# Patient Record
Sex: Male | Born: 1965 | Race: White | Hispanic: No | Marital: Married | Smoking: Never smoker
Health system: Southern US, Community
[De-identification: ages and names within clinical notes are randomized; demographics above are authoritative.]

## PROBLEM LIST (undated history)

## (undated) DIAGNOSIS — M1990 Unspecified osteoarthritis, unspecified site: Secondary | ICD-10-CM

## (undated) DIAGNOSIS — L409 Psoriasis, unspecified: Principal | ICD-10-CM

## (undated) DIAGNOSIS — I10 Essential (primary) hypertension: Secondary | ICD-10-CM

## (undated) DIAGNOSIS — L4050 Arthropathic psoriasis, unspecified: Secondary | ICD-10-CM

## (undated) HISTORY — DX: Arthropathic psoriasis, unspecified: L40.50

## (undated) HISTORY — DX: Unspecified osteoarthritis, unspecified site: M19.90

## (undated) HISTORY — PX: ADENOIDECTOMY: SUR15

## (undated) HISTORY — DX: Psoriasis, unspecified: L40.9

---

## 2011-12-19 LAB — LAB REPORT - SCANNED: HM Hepatitis Screen: NEGATIVE

## 2012-03-07 ENCOUNTER — Emergency Department: Payer: Self-pay | Admitting: Emergency Medicine

## 2015-10-03 LAB — CBC AND DIFFERENTIAL
HEMATOCRIT: 45 % (ref 41–53)
HEMOGLOBIN: 15.2 g/dL (ref 13.5–17.5)
PLATELETS: 247 10*3/uL (ref 150–399)
WBC: 6.9 10*3/mL

## 2015-10-03 LAB — BASIC METABOLIC PANEL
BUN: 9 mg/dL (ref 4–21)
CREATININE: 1 mg/dL (ref 0.6–1.3)
Glucose: 95 mg/dL
Potassium: 4.6 mmol/L (ref 3.4–5.3)
Sodium: 141 mmol/L (ref 137–147)

## 2015-10-03 LAB — HEPATIC FUNCTION PANEL
ALT: 41 U/L — AB (ref 10–40)
AST: 26 U/L (ref 14–40)
Alkaline Phosphatase: 65 U/L (ref 25–125)
BILIRUBIN, TOTAL: 0.7 mg/dL

## 2015-12-09 ENCOUNTER — Encounter: Payer: Self-pay | Admitting: *Deleted

## 2015-12-09 DIAGNOSIS — L409 Psoriasis, unspecified: Secondary | ICD-10-CM

## 2015-12-09 DIAGNOSIS — L4050 Arthropathic psoriasis, unspecified: Secondary | ICD-10-CM

## 2015-12-09 HISTORY — DX: Psoriasis, unspecified: L40.9

## 2015-12-09 HISTORY — DX: Arthropathic psoriasis, unspecified: L40.50

## 2015-12-09 NOTE — Progress Notes (Signed)
*IMAGE* Office Visit Note  Patient: Michael LamasMark Strahle             Date of Birth: 1965/07/30           MRN: [id number]             PCP: No primary care provider on file. Referring: No ref. provider found Visit Date: 12/11/2015 Occupation:@GUAROCC @    Subjective:  Follow-up Follow-up on psoriatic arthritis and psoriasis and high risk prescription  History of Present Illness: Michael Peck is a 50 y.o. male last seen in our office on 09/04/2015. On that visit patient was not doing well on Humira. He has been on Humira for the last 2-1/2 years. Because it wasn't addressing his psoriatic arthritis well, we decided to apply for Cosentyx and he is supposed to come back in November (i.e. today) to see if he is started Consentyx to see if it is working for him. In addition he was on methotrexate 4 pills every week and folic acid 2 every day but we had to stop methotrexate because his liver functions got elevated.  Patient reports today that he has been taking his CosEntex as we discussed.  Activities of Daily Living:  Patient reports morning stiffness for 30 minutes.   Patient Reports nocturnal pain.  Difficulty dressing/grooming: Reports Difficulty climbing stairs: Reports Difficulty getting out of chair: Reports Difficulty using hands for taps, buttons, cutlery, and/or writing: Reports   No Rheumatology ROS completed.   PMFS History:  Patient Active Problem List   Diagnosis Date Noted  . High risk medication use 12/10/2015  . Low back pain 12/10/2015  . Obesity 12/10/2015  . Psoriasis 12/09/2015  . Psoriatic arthritis (HCC) 12/09/2015    Past Medical History:  Diagnosis Date  . Arthritis   . Psoriasis 12/09/2015  . Psoriatic arthritis (HCC) 12/09/2015   Failed Enbrel    History reviewed. No pertinent family history. Past Surgical History:  Procedure Laterality Date  . ADENOIDECTOMY     Social History   Social History Narrative  . No narrative on file     Objective: Vital  Signs: BP (!) 145/85 (BP Location: Left Arm, Patient Position: Sitting, Cuff Size: Large)   Pulse 75   Resp 12   Ht 5\' 10"  (1.778 m)   Wt 223 lb (101.2 kg)   BMI 32.00 kg/m    Physical Exam   Musculoskeletal Exam:  Full range of motion of all joints Fibromyalgia points are all absent Grip strength is equal and strong bilaterally  CDAI Exam: No CDAI exam completed.   History of DIP PIP prominence bilaterally  Investigation: No additional findings.  X-rays done on 09/04/2015 of bilateral hands with 2 views and bilateral feet with 2 views. Please see July 2017 dictation for full details  Imaging: No results found.      Pt started Cosentyx recently.  Finished loading doses of Cosentyx.  Will start maintenance dose next week and will do it monthly. This dose is 300 mg every month.  So far the patient feels that he is seeing some benefit from this medication as he did when he was initially put on Humira.  He also feels that it is addressing his psoriasis somewhat.   Although it's too early to say we will check him versus these pictures in 3 months from now to see how much improvement he has seen.   Speciality Comments: No specialty comments available.   Procedures:  No procedures performed Allergies: Review of patient's allergies indicates  no known allergies.   Assessment / Plan: Visit Diagnoses: Psoriatic arthritis (HCC) - Failed Enbrel, Sacroiliitis, Dactylitis, Erosive disease Arthritis MUTILANS  Psoriasis  High risk medication use - FAILED ENBRELFAILED HUMIRATRIAL COSENTYXTHEN CONSIDER REMICADE INFUSION IF COSENTYX FAILS - Plan: Quantiferon tb gold assay (blood), CBC with Differential/Platelet, COMPLETE METABOLIC PANEL WITH GFR  Low back pain, unspecified back pain laterality, unspecified chronicity, with sciatica presence unspecified  Obesity, unspecified classification, unspecified obesity type, unspecified whether serious comorbidity present   I will  update tb gold today since it is due  I will get cbc w/ diff and cmp w/ gfr every 2 months starting today  I have advised the patient to f-u 3months  I discussed with the patient the following if CoSyntex fails ==>consider REMICADE Infusion in in a few months, no evidence of adequate response.  We have taken a photograph of patient's extensive psoriasis located mostly on the anterior tib-fib area below the knees.  We are hopeful that the Cosentyx will add benefit to the patient psoriasis and will get under better control as the months go by. I plan to check him again by doing another photographs at next visit.  In aproximately 3 months at the next visit to see if any improvement  Note that patient has Failed Humira  Note that patient has Failed Enberl in past;   Orders: Orders Placed This Encounter  Procedures  . Quantiferon tb gold assay (blood)  . CBC with Differential/Platelet  . COMPLETE METABOLIC PANEL WITH GFR   Meds ordered this encounter  Medications  . Secukinumab (COSENTYX SENSOREADY PEN) 150 MG/ML SOAJ    Sig: Inject 2 pens into the skin every 30 (thirty) days.    Dispense:  2 pen    Refill:  2  . methotrexate (RHEUMATREX) 2.5 MG tablet    Sig: Take 3 tablets (7.5 mg total) by mouth once a week. Caution:Chemotherapy. Protect from light.    Dispense:  12 tablet    Refill:  2  . folic acid (FOLVITE) 1 MG tablet    Sig: Take by mouth.  . DISCONTD: methotrexate (RHEUMATREX) 2.5 MG tablet    Sig: TAKE 4 TABLETS BY MOUTH WEEKLY  . DISCONTD: COSENTYX SENSOREADY 300 DOSE 150 MG/ML SOAJ    Face-to-face time spent with patient was 40 minutes. 50% of time was spent in counseling and coordination of care.  Follow-Up Instructions: Return in about 3 months (around 03/12/2016) for PsA, Ps lesion improvement, Cosentyx response still good?.     I examined and evaluated the patient with Tawni PummelNaitik Manvir Prabhu PA. The plan of care was discussed as noted above.  Pollyann SavoyShaili Deveshwar, MD

## 2015-12-10 DIAGNOSIS — M545 Low back pain, unspecified: Secondary | ICD-10-CM | POA: Insufficient documentation

## 2015-12-10 DIAGNOSIS — E669 Obesity, unspecified: Secondary | ICD-10-CM | POA: Insufficient documentation

## 2015-12-10 DIAGNOSIS — Z79899 Other long term (current) drug therapy: Secondary | ICD-10-CM | POA: Insufficient documentation

## 2015-12-10 NOTE — Assessment & Plan Note (Signed)
Failed Enbrel, Sacroiliitis, Dactylitis, Erosive disease  Arthritis MUTILANS

## 2015-12-11 ENCOUNTER — Ambulatory Visit (INDEPENDENT_AMBULATORY_CARE_PROVIDER_SITE_OTHER): Payer: 59 | Admitting: Rheumatology

## 2015-12-11 ENCOUNTER — Encounter: Payer: Self-pay | Admitting: Rheumatology

## 2015-12-11 ENCOUNTER — Other Ambulatory Visit: Payer: Self-pay | Admitting: Rheumatology

## 2015-12-11 VITALS — BP 145/85 | HR 75 | Resp 12 | Ht 70.0 in | Wt 223.0 lb

## 2015-12-11 DIAGNOSIS — L4050 Arthropathic psoriasis, unspecified: Secondary | ICD-10-CM | POA: Diagnosis not present

## 2015-12-11 DIAGNOSIS — L409 Psoriasis, unspecified: Secondary | ICD-10-CM

## 2015-12-11 DIAGNOSIS — Z79899 Other long term (current) drug therapy: Secondary | ICD-10-CM

## 2015-12-11 DIAGNOSIS — M545 Low back pain: Secondary | ICD-10-CM | POA: Diagnosis not present

## 2015-12-11 DIAGNOSIS — E669 Obesity, unspecified: Secondary | ICD-10-CM | POA: Diagnosis not present

## 2015-12-11 LAB — CBC WITH DIFFERENTIAL/PLATELET
Basophils Absolute: 63 cells/uL (ref 0–200)
Basophils Relative: 1 %
EOS PCT: 3 %
Eosinophils Absolute: 189 cells/uL (ref 15–500)
HCT: 43.1 % (ref 38.5–50.0)
HEMOGLOBIN: 14.5 g/dL (ref 13.2–17.1)
LYMPHS ABS: 2331 {cells}/uL (ref 850–3900)
Lymphocytes Relative: 37 %
MCH: 31.3 pg (ref 27.0–33.0)
MCHC: 33.6 g/dL (ref 32.0–36.0)
MCV: 93.1 fL (ref 80.0–100.0)
MONOS PCT: 10 %
MPV: 11.6 fL (ref 7.5–12.5)
Monocytes Absolute: 630 cells/uL (ref 200–950)
NEUTROS ABS: 3087 {cells}/uL (ref 1500–7800)
NEUTROS PCT: 49 %
PLATELETS: 233 10*3/uL (ref 140–400)
RBC: 4.63 MIL/uL (ref 4.20–5.80)
RDW: 13.6 % (ref 11.0–15.0)
WBC: 6.3 10*3/uL (ref 3.8–10.8)

## 2015-12-11 MED ORDER — METHOTREXATE 2.5 MG PO TABS: 7.5000 mg | ORAL_TABLET | ORAL | 2 refills | Status: DC

## 2015-12-11 MED ORDER — SECUKINUMAB 150 MG/ML ~~LOC~~ SOAJ: 2.0000 "pen " | SUBCUTANEOUS | 2 refills | Status: DC

## 2015-12-12 LAB — COMPLETE METABOLIC PANEL WITH GFR
ALBUMIN: 4.1 g/dL (ref 3.6–5.1)
ALK PHOS: 58 U/L (ref 40–115)
ALT: 34 U/L (ref 9–46)
AST: 25 U/L (ref 10–40)
BUN: 16 mg/dL (ref 7–25)
CO2: 23 mmol/L (ref 20–31)
Calcium: 9 mg/dL (ref 8.6–10.3)
Chloride: 106 mmol/L (ref 98–110)
Creat: 0.95 mg/dL (ref 0.60–1.35)
GFR, Est African American: >89 mL/min (ref >=60)
GFR, Est Non African American: >89 mL/min (ref >=60)
GLUCOSE: 114 mg/dL — AB (ref 65–99)
POTASSIUM: 3.9 mmol/L (ref 3.5–5.3)
SODIUM: 140 mmol/L (ref 135–146)
Total Bilirubin: 0.7 mg/dL (ref 0.2–1.2)
Total Protein: 6.8 g/dL (ref 6.1–8.1)

## 2015-12-14 LAB — QUANTIFERON TB GOLD ASSAY (BLOOD)
Interferon Gamma Release Assay: NEGATIVE
MITOGEN-NIL SO: 2.61 [IU]/mL
QUANTIFERON TB AG MINUS NIL: 0.02 [IU]/mL
Quantiferon Nil Value: 0.03 IU/mL

## 2015-12-14 NOTE — Progress Notes (Signed)
Labs (cbc cmp tbgold) are wnl

## 2016-01-21 ENCOUNTER — Other Ambulatory Visit: Payer: Self-pay | Admitting: *Deleted

## 2016-01-21 MED ORDER — METHOTREXATE 2.5 MG PO TABS: 7.5000 mg | ORAL_TABLET | ORAL | 0 refills | Status: DC

## 2016-01-21 NOTE — Telephone Encounter (Signed)
Received request for Methotrexate to be filled as a 90 day supply via fax.   Last Visit: 12/11/15 Next visit: 04/01/15 Labs: 12/11/15 WNL  Prescription changed to 90 supply.

## 2016-03-18 ENCOUNTER — Ambulatory Visit: Payer: 59 | Admitting: Rheumatology

## 2016-03-21 ENCOUNTER — Other Ambulatory Visit: Payer: Self-pay | Admitting: Rheumatology

## 2016-03-21 NOTE — Telephone Encounter (Signed)
Last Visit: 12/11/15 Next Visit: 03/31/16 Labs: 12/11/15 WNL TB Gold: 12/11/15 Neg  Okay to refill Cosentyx?

## 2016-03-24 NOTE — Progress Notes (Signed)
Office Visit Note  Patient: Michael Peck             Date of Birth: 02-26-1965           MRN: [id number]             PCP: No primary care provider on file. Referring: No ref. provider found Visit Date: 03/31/2016 Occupation: _0 @    Subjective: Follow up on Psoriatic arthritis   History of Present Illness: Michael Peck is a 51 y.o. male with history of psoriasis and psoriatic arthritis. He is been on Cosyntex in September 2017. He has not noticed much improvement in his joint symptoms. He states his psoriasis has not changed much except that it response to the topical agents. He is been tolerating Cosyntex well. He's been taking methotrexate only 3 tablets per week due to prior elevation of LFTs. He reports pain and discomfort mostly in his hands. He had an episode of lower back pain about a month ago which resolved.  Activities of Daily Living:  Patient reports morning stiffness for15  minutes.   Patient Denies nocturnal pain.  Difficulty dressing/grooming: Denies Difficulty climbing stairs: Denies Difficulty getting out of chair: Denies Difficulty using hands for taps, buttons, cutlery, and/or writing: Denies   Review of Systems  Constitutional: Negative for fatigue, night sweats and weakness ( ).  HENT: Negative for mouth sores, mouth dryness and nose dryness.   Eyes: Negative for redness and dryness.  Respiratory: Negative for shortness of breath and difficulty breathing.   Cardiovascular: Positive for hypertension. Negative for chest pain, palpitations, irregular heartbeat and swelling in legs/feet.  Gastrointestinal: Negative for constipation and diarrhea.  Endocrine: Negative for increased urination.  Musculoskeletal: Positive for arthralgias and joint pain. Negative for joint swelling, myalgias, muscle weakness, morning stiffness, muscle tenderness and myalgias.  Skin: Positive for rash. Negative for color change, hair loss, nodules/bumps, skin tightness, ulcers and  sensitivity to sunlight.       Psoriasis  Allergic/Immunologic: Negative for susceptible to infections.  Neurological: Negative for dizziness, fainting, memory loss and night sweats.  Hematological: Negative for swollen glands.  Psychiatric/Behavioral: Negative for depressed mood and sleep disturbance. The patient is not nervous/anxious.     PMFS History:  Patient Active Problem List   Diagnosis Date Noted  . High risk medication use 12/10/2015  . Low back pain 12/10/2015  . Obesity 12/10/2015  . Psoriasis 12/09/2015  . Psoriatic arthritis ([place]) 12/09/2015    Past Medical History:  Diagnosis Date  . Arthritis   . Psoriasis 12/09/2015  . Psoriatic arthritis ([place]) 12/09/2015   Failed Enbrel    No family history on file. Past Surgical History:  Procedure Laterality Date  . ADENOIDECTOMY     Social History   Social History Narrative  . No narrative on file     Objective: Vital Signs: BP 136/78   Pulse 78   Resp 14   Ht _1  (1.778 m)   Wt 220 lb (99.8 kg)   BMI 31.57 kg/m    Physical Exam  Constitutional: He is oriented to person, place, and time. He appears well-developed and well-nourished.  HENT:  Head: Normocephalic and atraumatic.  Eyes: Conjunctivae and EOM are normal. Pupils are equal, round, and reactive to light.  Neck: Normal range of motion. Neck supple.  Cardiovascular: Normal rate, regular rhythm and normal heart sounds.   Pulmonary/Chest: Effort normal and breath sounds normal.  Abdominal: Soft. Bowel sounds are normal.  Neurological: He is alert and oriented  to person, place, and time.  Skin: Skin is warm and dry. Capillary refill takes less than 2 seconds. Rash noted.  Rash localized to bilateral elbows, bilateral shin area  Psychiatric: He has a normal mood and affect. His behavior is normal.  Nursing note and vitals reviewed.    Musculoskeletal Exam: C-spine, thoracic, lumbar spine good range of motion to SI joint tenderness. Shoulder joints  elbow joints wrist joints with good range of motion. 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 Hip joints knee joints ankles MTPs PIPs with good range of motion with no synovitis.  CDAI Exam: CDAI Homunculus Exam:   Tenderness:  Right hand: 3rd PIP and 2nd DIP Left hand: 3rd PIP and 2nd DIP  Joint Counts:  CDAI Tender Joint count: 2 CDAI Swollen Joint count: 0  Global Assessments:  Patient Global Assessment: 7 Provider Global Assessment: 4  CDAI Calculated Score: 13    Investigation: Findings:  TB gold neg 12/2015  Office Visit on 12/11/2015  Component Date Value Ref Range Status  . Interferon Gamma Release Assay 12/11/2015 NEGATIVE  NEGATIVE Final  . Quantiferon Nil Value 12/11/2015 0.03  IU/mL Final  . Mitogen-Nil 12/11/2015 2.61  IU/mL Final  . Quantiferon Tb Ag Minus Nil Value 12/11/2015 0.02  IU/mL Final   Comment:   The Nil tube value is used to determine if the patient has a preexisting immune response which could cause a false-positive reading on the test. In order for a test to be valid, the Nil tube must have a value of less than or equal to 8.0 IU/mL.   The mitogen control tube is used to assure the patient has a healthy immune status and also serves as a control for correct blood handling and incubation. It is used to detect false-negative readings. The mitogen tube must have a gamma interferon value of greater than or equal to 0.5 IU/mL higher than the value of the Nil tube.   The TB antigen tube is coated with the M. tuberculosis specific antigens. For a test to be considered positive, the TB antigen tube value minus the Nil tube value must be greater than or equal to 0.35 IU/mL.   For additional information, please refer to http://education.[facility].com/faq/QFT (This link is being provided for informational/educational purposes only.)   . WBC 12/11/2015 6.3  3.8 - 10.8  K/uL Final  . RBC 12/11/2015 4.63  4.20 - 5.80 MIL/uL Final  . Hemoglobin 12/11/2015 14.5  13.2 - 17.1 g/dL Final  . HCT 12/11/2015 43.1  38.5 - 50.0 % Final  . MCV 12/11/2015 93.1  80.0 - 100.0 fL Final  . MCH 12/11/2015 31.3  27.0 - 33.0 pg Final  . MCHC 12/11/2015 33.6  32.0 - 36.0 g/dL Final  . RDW 12/11/2015 13.6  11.0 - 15.0 % Final  . Platelets 12/11/2015 233  140 - 400 K/uL Final  . MPV 12/11/2015 11.6  7.5 - 12.5 fL Final  . Neutro Abs 12/11/2015 3087  1,500 - 7,800 cells/uL Final  . Lymphs Abs 12/11/2015 2331  850 - 3,900 cells/uL Final  . Monocytes Absolute 12/11/2015 630  200 - 950 cells/uL Final  . Eosinophils Absolute 12/11/2015 189  15 - 500 cells/uL Final  . Basophils Absolute 12/11/2015 63  0 - 200 cells/uL Final  . Neutrophils Relative % 12/11/2015 49  % Final  . Lymphocytes Relative 12/11/2015 37  % Final  . Monocytes Relative 12/11/2015 10  %  Final  . Eosinophils Relative 12/11/2015 3  % Final  . Basophils Relative 12/11/2015 1  % Final  . Smear Review 12/11/2015 Criteria for review not met   Final  . Sodium 12/11/2015 140  135 - 146 mmol/L Final  . Potassium 12/11/2015 3.9  3.5 - 5.3 mmol/L Final  . Chloride 12/11/2015 106  98 - 110 mmol/L Final  . CO2 12/11/2015 23  20 - 31 mmol/L Final  . Glucose, Bld 12/11/2015 114* 65 - 99 mg/dL Final  . BUN 12/11/2015 16  7 - 25 mg/dL Final  . Creat 12/11/2015 0.95  0.60 - 1.35 mg/dL Final  . Total Bilirubin 12/11/2015 0.7  0.2 - 1.2 mg/dL Final  . Alkaline Phosphatase 12/11/2015 58  40 - 115 U/L Final  . AST 12/11/2015 25  10 - 40 U/L Final  . ALT 12/11/2015 34  9 - 46 U/L Final  . Total Protein 12/11/2015 6.8  6.1 - 8.1 g/dL Final  . Albumin 12/11/2015 4.1  3.6 - 5.1 g/dL Final  . Calcium 12/11/2015 9.0  8.6 - 10.3 mg/dL Final  . GFR, Est African American 12/11/2015 >89  >=60 mL/min Final  . GFR, Est Non African American 12/11/2015 >89  >=60 mL/min Final  Abstract on 12/09/2015  Component Date Value Ref Range Status    . Hemoglobin 10/03/2015 15.2  13.5 - 17.5 g/dL Final  . HCT 10/03/2015 45  41 - 53 % Final  . Platelets 10/03/2015 247  150 - 399 K/L Final  . WBC 10/03/2015 6.9  10^3/mL Final  . Glucose 10/03/2015 95  mg/dL Final  . BUN 10/03/2015 9  4 - 21 mg/dL Final  . Creatinine 10/03/2015 1.0  0.6 - 1.3 mg/dL Final  . Potassium 10/03/2015 4.6  3.4 - 5.3 mmol/L Final  . Sodium 10/03/2015 141  137 - 147 mmol/L Final  . Alkaline Phosphatase 10/03/2015 65  25 - 125 U/L Final  . ALT 10/03/2015 41* 10 - 40 U/L Final  . AST 10/03/2015 26  14 - 40 U/L Final  . Bilirubin, Total 10/03/2015 0.7  mg/dL Final     Imaging: No results found.   Speciality Comments: No specialty comments available.    Procedures:  No procedures performed Allergies: Patient has no known allergies.   Assessment / Plan:     Visit Diagnoses: Psoriasis: He is still have quite extensive lesion involving his elbows and bilateral shins.Although compared to his previous for graphs the rash appears better.  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 He does not have any active synovitis on examination today although he complains of arthralgias and some tenderness across has PIPs and DIPs in his hands. no synovitis was noted. I believe his symptoms have improved on Cosyntex. Although as he still having significant symptoms of would like to increase his methotrexate. He has had problems with the elevated LFTs in the past. Now his LFTs were normal I would increase his methotrexate to 6 tablets per week. If the labs are normal in a month I will increase it further to 8 tablets by mouth Peck week. Indications CONTRAINDICATIONS were reviewed and abstinence from alcohol was discussed.  High risk medication use - Failed Enbrel, Humira, now on Cosyntex since September 2017 and methotrexate 3 tablets per week, folic acid 1 mg by mouth daily. Labs are due today.   Low back pain: better  Obesity : Weight loss diet and exercise was  discussed.   Orders: Orders Placed This Encounter  Procedures  .  CBC with Differential/Platelet  . COMPLETE METABOLIC PANEL WITH GFR   Meds ordered this encounter  Medications  . Secukinumab (COSENTYX SENSOREADY 300 DOSE) 150 MG/ML SOAJ    Sig: Inject 2 pens into the skin Peck 28 (twenty-eight) days.    Dispense:  6 pen    Refill:  0  . methotrexate (RHEUMATREX) 2.5 MG tablet    Sig: Take 6 tablets (15 mg total) by mouth once a week. Caution:Chemotherapy. Protect from light.    Dispense:  24 tablet    Refill:  0    Face-to-face time spent with patient was 35 minutes. 50% of time was spent in counseling and coordination of care.  Follow-Up Instructions: Return in about 4 months (around 07/29/2016) for Psoriatic arthritis.   Bo Merino, MD  Note - This record has been created using Editor, commissioning.  Chart creation errors have been sought, but may not always  have been located. Such creation errors do not reflect on  the standard of medical care.

## 2016-03-31 ENCOUNTER — Ambulatory Visit (INDEPENDENT_AMBULATORY_CARE_PROVIDER_SITE_OTHER): Payer: BLUE CROSS/BLUE SHIELD | Admitting: Rheumatology

## 2016-03-31 ENCOUNTER — Encounter: Payer: Self-pay | Admitting: Rheumatology

## 2016-03-31 VITALS — BP 136/78 | HR 78 | Resp 14 | Ht 70.0 in | Wt 220.0 lb

## 2016-03-31 DIAGNOSIS — L4050 Arthropathic psoriasis, unspecified: Secondary | ICD-10-CM | POA: Diagnosis not present

## 2016-03-31 DIAGNOSIS — Z79899 Other long term (current) drug therapy: Secondary | ICD-10-CM | POA: Diagnosis not present

## 2016-03-31 DIAGNOSIS — M545 Low back pain, unspecified: Secondary | ICD-10-CM

## 2016-03-31 DIAGNOSIS — E669 Obesity, unspecified: Secondary | ICD-10-CM

## 2016-03-31 DIAGNOSIS — L409 Psoriasis, unspecified: Secondary | ICD-10-CM | POA: Diagnosis not present

## 2016-03-31 LAB — CBC WITH DIFFERENTIAL/PLATELET
BASOS PCT: 0 %
Basophils Absolute: 0 cells/uL (ref 0–200)
EOS ABS: 380 {cells}/uL (ref 15–500)
Eosinophils Relative: 5 %
HCT: 44.8 % (ref 38.5–50.0)
HEMOGLOBIN: 15.5 g/dL (ref 13.2–17.1)
LYMPHS ABS: 2432 {cells}/uL (ref 850–3900)
Lymphocytes Relative: 32 %
MCH: 31.2 pg (ref 27.0–33.0)
MCHC: 34.6 g/dL (ref 32.0–36.0)
MCV: 90.1 fL (ref 80.0–100.0)
MONOS PCT: 7 %
MPV: 11.4 fL (ref 7.5–12.5)
Monocytes Absolute: 532 cells/uL (ref 200–950)
NEUTROS ABS: 4256 {cells}/uL (ref 1500–7800)
Neutrophils Relative %: 56 %
PLATELETS: 237 10*3/uL (ref 140–400)
RBC: 4.97 MIL/uL (ref 4.20–5.80)
RDW: 14 % (ref 11.0–15.0)
WBC: 7.6 10*3/uL (ref 3.8–10.8)

## 2016-03-31 LAB — COMPLETE METABOLIC PANEL WITH GFR
ALBUMIN: 4.4 g/dL (ref 3.6–5.1)
ALT: 28 U/L (ref 9–46)
AST: 22 U/L (ref 10–40)
Alkaline Phosphatase: 66 U/L (ref 40–115)
BILIRUBIN TOTAL: 0.5 mg/dL (ref 0.2–1.2)
BUN: 12 mg/dL (ref 7–25)
CO2: 27 mmol/L (ref 20–31)
CREATININE: 1.02 mg/dL (ref 0.60–1.35)
Calcium: 9.3 mg/dL (ref 8.6–10.3)
Chloride: 103 mmol/L (ref 98–110)
GFR, Est African American: >89 mL/min (ref >=60)
GFR, Est Non African American: 86 mL/min (ref >=60)
GLUCOSE: 82 mg/dL (ref 65–99)
Potassium: 4.4 mmol/L (ref 3.5–5.3)
SODIUM: 140 mmol/L (ref 135–146)
TOTAL PROTEIN: 7.5 g/dL (ref 6.1–8.1)

## 2016-03-31 MED ORDER — CALCIPOTRIENE-BETAMETH DIPROP 0.005-0.064 % EX OINT: TOPICAL_OINTMENT | Freq: Every day | CUTANEOUS | 0 refills | Status: DC

## 2016-03-31 MED ORDER — METHOTREXATE 2.5 MG PO TABS: 15.0000 mg | ORAL_TABLET | ORAL | 0 refills | Status: DC

## 2016-03-31 MED ORDER — SECUKINUMAB 150 MG/ML ~~LOC~~ SOAJ: 2.0000 "pen " | SUBCUTANEOUS | 0 refills | Status: DC

## 2016-03-31 MED ORDER — CLOBETASOL PROPIONATE 0.05 % EX LOTN: 1.0000 "application " | TOPICAL_LOTION | Freq: Two times a day (BID) | CUTANEOUS | 0 refills | Status: DC

## 2016-03-31 MED ORDER — FOLIC ACID 1 MG PO TABS: 2.0000 mg | ORAL_TABLET | Freq: Every day | ORAL | 3 refills | Status: DC

## 2016-03-31 NOTE — Patient Instructions (Addendum)
Increase your methotrexate to 6 per week Return for labs in 3 and a half weeks, we will let you know after those labs if you can increase your methotrexate to 8 per week, or stay at 6 per week. Labs will be due every month 2 and then every 2 months.

## 2016-03-31 NOTE — Progress Notes (Signed)
Rheumatology Medication Review by a Pharmacist Does the patient feel that his/her medications are working for him/her?  No Has the patient been experiencing any side effects to the medications prescribed?  No Does the patient have any problems obtaining medications?  Reports he has had difficulty getting his Cosentyx prescription.  Refill was sent 03/21/16, but patient reports he did not get the refill from the pharmacy.  Confirmed with patient that he uses [facility].  The prescription was resent today.    Issues to address at subsequent visits: None   Pharmacist comments:  Mr. Eulah PontMurphy is a pleasant 51 yo M who presents for follow up of psoriatic arthritis.  He is currently taking Cosentyx 300 mg every 28 days, methotrexate 3 tablets weekly.  He reports he has not been taking folic acid.  I reviewed the importance of taking folic acid daily with methotrexate.  Patient voiced understanding and reports he will start taking folic acid again.  Patient's most recent standing labs were on 12/11/15 which were normal.  He is due for standing labs today.  Most recent TB Gold was also on 12/11/15.  He will be due for TB Gold again in November 2018.  Patient denies any questions regarding his medications at this time.    Lilla Shookachel Flor Houdeshell, Pharm.D., BCPS, CPP Clinical Pharmacist Pager: [phone] Phone: [phone] 03/31/2016 2:03 PM

## 2016-04-01 NOTE — Progress Notes (Signed)
WNL

## 2016-05-21 ENCOUNTER — Other Ambulatory Visit: Payer: Self-pay | Admitting: Rheumatology

## 2016-05-23 NOTE — Telephone Encounter (Signed)
Last Visit: 03/31/16 Next Visit: 07/29/16 Labs: 03/31/16 WNL  Okay to refill MTX?

## 2016-05-23 NOTE — Telephone Encounter (Signed)
ok 

## 2016-05-27 ENCOUNTER — Other Ambulatory Visit: Payer: Self-pay | Admitting: Radiology

## 2016-05-27 DIAGNOSIS — Z13220 Encounter for screening for lipoid disorders: Secondary | ICD-10-CM

## 2016-05-27 DIAGNOSIS — Z125 Encounter for screening for malignant neoplasm of prostate: Secondary | ICD-10-CM

## 2016-05-27 DIAGNOSIS — Z79899 Other long term (current) drug therapy: Secondary | ICD-10-CM

## 2016-05-27 LAB — CBC WITH DIFFERENTIAL/PLATELET
BASOS ABS: 0 {cells}/uL (ref 0–200)
BASOS PCT: 0 %
EOS ABS: 132 {cells}/uL (ref 15–500)
Eosinophils Relative: 2 %
HCT: 43 % (ref 38.5–50.0)
Hemoglobin: 14.5 g/dL (ref 13.2–17.1)
LYMPHS PCT: 31 %
Lymphs Abs: 2046 cells/uL (ref 850–3900)
MCH: 30.9 pg (ref 27.0–33.0)
MCHC: 33.7 g/dL (ref 32.0–36.0)
MCV: 91.7 fL (ref 80.0–100.0)
MONO ABS: 660 {cells}/uL (ref 200–950)
MONOS PCT: 10 %
MPV: 11.2 fL (ref 7.5–12.5)
NEUTROS PCT: 57 %
Neutro Abs: 3762 cells/uL (ref 1500–7800)
Platelets: 241 10*3/uL (ref 140–400)
RBC: 4.69 MIL/uL (ref 4.20–5.80)
RDW: 14.5 % (ref 11.0–15.0)
WBC: 6.6 10*3/uL (ref 3.8–10.8)

## 2016-05-27 LAB — COMPLETE METABOLIC PANEL WITH GFR
ALT: 21 U/L (ref 9–46)
AST: 16 U/L (ref 10–35)
Albumin: 4 g/dL (ref 3.6–5.1)
Alkaline Phosphatase: 65 U/L (ref 40–115)
BUN: 11 mg/dL (ref 7–25)
CHLORIDE: 106 mmol/L (ref 98–110)
CO2: 28 mmol/L (ref 20–31)
Calcium: 9.5 mg/dL (ref 8.6–10.3)
Creat: 0.99 mg/dL (ref 0.70–1.33)
GFR, Est African American: >89 mL/min (ref >=60)
GFR, Est Non African American: 88 mL/min (ref >=60)
GLUCOSE: 104 mg/dL — AB (ref 65–99)
POTASSIUM: 4.9 mmol/L (ref 3.5–5.3)
SODIUM: 140 mmol/L (ref 135–146)
Total Bilirubin: 0.6 mg/dL (ref 0.2–1.2)
Total Protein: 6.8 g/dL (ref 6.1–8.1)

## 2016-05-27 LAB — LIPID PANEL
Cholesterol: 144 mg/dL (ref <200)
HDL: 34 mg/dL — ABNORMAL LOW (ref >40)
LDL CALC: 87 mg/dL (ref <100)
Total CHOL/HDL Ratio: 4.2 Ratio (ref <5.0)
Triglycerides: 113 mg/dL (ref <150)
VLDL: 23 mg/dL (ref <30)

## 2016-05-28 LAB — URINALYSIS, ROUTINE W REFLEX MICROSCOPIC
Bilirubin Urine: NEGATIVE
GLUCOSE, UA: NEGATIVE
Hgb urine dipstick: NEGATIVE
KETONES UR: NEGATIVE
Leukocytes, UA: NEGATIVE
Nitrite: NEGATIVE
PH: 6 (ref 5.0–8.0)
PROTEIN: NEGATIVE
Specific Gravity, Urine: 1.018 (ref 1.001–1.035)

## 2016-05-28 LAB — PSA: PSA: 0.5 ng/mL (ref <=4.0)

## 2016-05-30 NOTE — Progress Notes (Signed)
WNL, HDLis low

## 2016-06-02 ENCOUNTER — Other Ambulatory Visit: Payer: Self-pay | Admitting: Rheumatology

## 2016-06-02 NOTE — Telephone Encounter (Signed)
03/31/16 last visit  07/29/16 next visit  CBC CMP normal 05/27/16 Ok to refill per Dr Corliss Skains

## 2016-07-06 ENCOUNTER — Other Ambulatory Visit: Payer: Self-pay | Admitting: Rheumatology

## 2016-07-06 NOTE — Telephone Encounter (Signed)
03/31/16 last visit  07/29/16 next visit  CBC CMP normal 05/27/16 TB Gold: 12/11/15 Neg  Okay to refill Cosentyx?

## 2016-07-06 NOTE — Telephone Encounter (Signed)
ok 

## 2016-07-28 NOTE — Progress Notes (Deleted)
Office Visit Note  Patient: Michael Peck             Date of Birth: 06/10/1965           MRN: [id number]             PCP: No primary care provider on file. Referring: No ref. provider found Visit Date: 07/29/2016 Occupation: @GUAROCC @    Subjective:  No chief complaint on file.   History of Present Illness: Michael Peck is a 51 y.o. male ***   Activities of Daily Living:  Patient reports morning stiffness for *** {minute/hour:19697}.   Patient {ACTIONS;DENIES/REPORTS:21021675::"Denies"} nocturnal pain.  Difficulty dressing/grooming: {ACTIONS;DENIES/REPORTS:21021675::"Denies"} Difficulty climbing stairs: {ACTIONS;DENIES/REPORTS:21021675::"Denies"} Difficulty getting out of chair: {ACTIONS;DENIES/REPORTS:21021675::"Denies"} Difficulty using hands for taps, buttons, cutlery, and/or writing: {ACTIONS;DENIES/REPORTS:21021675::"Denies"}   No Rheumatology ROS completed.   PMFS History:  Patient Active Problem List   Diagnosis Date Noted  . High risk medication use 12/10/2015  . Low back pain 12/10/2015  . Obesity 12/10/2015  . Psoriasis 12/09/2015  . Psoriatic arthritis (HCC) 12/09/2015    Past Medical History:  Diagnosis Date  . Arthritis   . Psoriasis 12/09/2015  . Psoriatic arthritis (HCC) 12/09/2015   Failed Enbrel    No family history on file. Past Surgical History:  Procedure Laterality Date  . ADENOIDECTOMY     Social History   Social History Narrative  . No narrative on file     Objective: Vital Signs: There were no vitals taken for this visit.   Physical Exam   Musculoskeletal Exam: ***  CDAI Exam: No CDAI exam completed.    Investigation: Findings:  12/24/2015 negative TB gold   CBC Latest Ref Rng & Units 05/27/2016 03/31/2016 12/11/2015  WBC 3.8 - 10.8 K/uL 6.6 7.6 6.3  Hemoglobin 13.2 - 17.1 g/dL 78.214.5 95.615.5 21.314.5  Hematocrit 38.5 - 50.0 % 43.0 44.8 43.1  Platelets 140 - 400 K/uL 241 237 233   CMP Latest Ref Rng & Units 05/27/2016 03/31/2016  12/11/2015  Glucose 65 - 99 mg/dL 086(V104(H) 82 784(O114(H)  BUN 7 - 25 mg/dL 11 12 16   Creatinine 0.70 - 1.33 mg/dL 9.620.99 9.521.02 8.410.95  Sodium 135 - 146 mmol/L 140 140 140  Potassium 3.5 - 5.3 mmol/L 4.9 4.4 3.9  Chloride 98 - 110 mmol/L 106 103 106  CO2 20 - 31 mmol/L 28 27 23   Calcium 8.6 - 10.3 mg/dL 9.5 9.3 9.0  Total Protein 6.1 - 8.1 g/dL 6.8 7.5 6.8  Total Bilirubin 0.2 - 1.2 mg/dL 0.6 0.5 0.7  Alkaline Phos 40 - 115 U/L 65 66 58  AST 10 - 35 U/L 16 22 25   ALT 9 - 46 U/L 21 28 34    Imaging: No results found.  Speciality Comments: No specialty comments available.    Procedures:  No procedures performed Allergies: Patient has no known allergies.   Assessment / Plan:     Visit Diagnoses: Psoriatic arthritis (HCC)  Psoriasis  High risk medication use - Cosentyx   Chronic midline low back pain without sciatica    Orders: No orders of the defined types were placed in this encounter.  No orders of the defined types were placed in this encounter.   Face-to-face time spent with patient was *** minutes. 50% of time was spent in counseling and coordination of care.  Follow-Up Instructions: No Follow-up on file.   Amy Littrell, RT  Note - This record has been created using [employer] software.  Chart creation errors have been sought,  but may not always  have been located. Such creation errors do not reflect on  the standard of medical care.

## 2016-07-29 ENCOUNTER — Ambulatory Visit: Payer: BLUE CROSS/BLUE SHIELD | Admitting: Rheumatology

## 2016-08-02 NOTE — Progress Notes (Signed)
Office Visit Note  Patient: Michael Peck             Date of Birth: 05-10-1965           MRN: [id number]             PCP: [place] Pink, MD Referring: No ref. provider found Visit Date: 08/04/2016 Occupation: '@GUAROCC' @    Subjective:  Medication Management   History of Present Illness: Michael Peck is a 51 y.o. male with history of psoriatic arthritis. He states he has some stiffness and soreness in his joints but no joint swelling. He has not had any dactylitis lately. He has minimal discomfort in the SI joint area. He's been tolerating methotrexate and Cosyntex well.  Activities of Daily Living:  Patient reports morning stiffness for 5 minutes.   Patient Denies nocturnal pain.  Difficulty dressing/grooming: Denies Difficulty climbing stairs: Denies Difficulty getting out of chair: Denies Difficulty using hands for taps, buttons, cutlery, and/or writing: Denies   Review of Systems  Constitutional: Negative for fatigue, night sweats and weakness ( ).  HENT: Negative for mouth sores, mouth dryness and nose dryness.   Eyes: Positive for dryness. Negative for redness.  Respiratory: Negative for shortness of breath and difficulty breathing.   Cardiovascular: Negative for chest pain, palpitations, hypertension, irregular heartbeat and swelling in legs/feet.  Gastrointestinal: Negative for constipation and diarrhea.  Endocrine: Negative for increased urination.  Musculoskeletal: Positive for arthralgias, joint pain and morning stiffness. Negative for joint swelling, myalgias, muscle weakness, muscle tenderness and myalgias.  Skin: Negative for color change, rash, hair loss, nodules/bumps, skin tightness, ulcers and sensitivity to sunlight.  Allergic/Immunologic: Negative for susceptible to infections.  Neurological: Negative for dizziness, fainting, memory loss and night sweats.  Hematological: Negative for swollen glands.  Psychiatric/Behavioral: Negative for depressed mood and  sleep disturbance. The patient is not nervous/anxious.     PMFS History:  Patient Active Problem List   Diagnosis Date Noted  . High risk medication use 12/10/2015  . Low back pain 12/10/2015  . Obesity 12/10/2015  . Psoriasis 12/09/2015  . Psoriatic arthritis ([place]) 12/09/2015    Past Medical History:  Diagnosis Date  . Arthritis   . Psoriasis 12/09/2015  . Psoriatic arthritis ([facility]) 12/09/2015   Failed Enbrel    No family history on file. Past Surgical History:  Procedure Laterality Date  . ADENOIDECTOMY     Social History   Social History Narrative  . No narrative on file     Objective: Vital Signs: BP 138/78   Pulse 74   Resp 14   Ht '5\' 10"'  (1.778 m)   Wt 225 lb (102.1 kg)   BMI 32.28 kg/m    Physical Exam  Constitutional: He is oriented to person, place, and time. He appears well-developed and well-nourished.  HENT:  Head: Normocephalic and atraumatic.  Eyes: Conjunctivae and EOM are normal. Pupils are equal, round, and reactive to light.  Neck: Normal range of motion. Neck supple.  Cardiovascular: Normal rate, regular rhythm and normal heart sounds.   Pulmonary/Chest: Effort normal and breath sounds normal.  Abdominal: Soft. Bowel sounds are normal.  Neurological: He is alert and oriented to person, place, and time.  Skin: Skin is warm and dry. Capillary refill takes less than 2 seconds. Rash noted.  Psoriasis patches on bilateral lower extremities  Psychiatric: He has a normal mood and affect. His behavior is normal.  Nursing note and vitals reviewed.    Musculoskeletal Exam: C-spine and thoracic lumbar spine  good range of motion. He has some discomfort over right SI joint. Shoulder joints elbow joints wrist joints are good range of motion. 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 But no active synovitis was noted. Hip joints knee joints ankles MTPs PIPs with good range of motion with no synovitis.  CDAI Exam: CDAI Homunculus Exam:    Joint Counts:  CDAI Tender Joint count: 0 CDAI Swollen Joint count: 0  Global Assessments:  Patient Global Assessment: 2 Provider Global Assessment: 2  CDAI Calculated Score: 4    Investigation: Findings:  12/2015 negative TB gold    CBC Latest Ref Rng & Units 05/27/2016 03/31/2016 12/11/2015  WBC 3.8 - 10.8 K/uL 6.6 7.6 6.3  Hemoglobin 13.2 - 17.1 g/dL 14.5 15.5 14.5  Hematocrit 38.5 - 50.0 % 43.0 44.8 43.1  Platelets 140 - 400 K/uL 241 237 233   CMP Latest Ref Rng & Units 05/27/2016 03/31/2016 12/11/2015  Glucose 65 - 99 mg/dL 104(H) 82 114(H)  BUN 7 - 25 mg/dL '11 12 16  ' Creatinine 0.70 - 1.33 mg/dL 0.99 1.02 0.95  Sodium 135 - 146 mmol/L 140 140 140  Potassium 3.5 - 5.3 mmol/L 4.9 4.4 3.9  Chloride 98 - 110 mmol/L 106 103 106  CO2 20 - 31 mmol/L '28 27 23  ' Calcium 8.6 - 10.3 mg/dL 9.5 9.3 9.0  Total Protein 6.1 - 8.1 g/dL 6.8 7.5 6.8  Total Bilirubin 0.2 - 1.2 mg/dL 0.6 0.5 0.7  Alkaline Phos 40 - 115 U/L 65 66 58  AST 10 - 35 U/L '16 22 25  ' ALT 9 - 46 U/L 21 28 34       Imaging: No results found.  Speciality Comments: No specialty comments available.    Procedures:  No procedures performed Allergies: Patient has no known allergies.   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 His joint symptoms have definitely improved.  Psoriasis: History of active psoriasis involving his bilateral lower extremities with severe rash. It is improved to some extent.  Sacroiliitis [facility]): He has minimal SI joint comfort discomfort on right side.  High risk medication use - Cosentyx 300 mg sq q wk, Methotrexate 6 tablets poq wk, Folic acid 2 mg po qd. Labs are due in 7/18, Tb gold 11/18.(FAILED ENBREL & Humira). I had detailed discussion regarding different treatment options I think he will do better on subcutaneous methotrexate. I will  change him to methotrexate 20 mg subcutaneously every week. He will discontinue by mouth methotrexate and continue folic acid. We will check labs in 2 weeks after changing to subcutaneous methotrexate. And then every 3 months to monitor for drug toxicity.  Chronic midline low back pain without sciatica  Class 1 obesity     Orders: No orders of the defined types were placed in this encounter.  Meds ordered this encounter  Medications  . Tuberculin-Allergy Syringes 27G X 1/2" 1 ML KIT    Sig: Inject 1 Syringe into the skin once a week. To be used with weekly methotrexate injections.    Dispense:  12 each    Refill:  3  . methotrexate 50 MG/2ML injection    Sig: Inject 0.8 mL subcutaneous weekly    Dispense:  4 mL    Refill:  2    Please dispense 2 mL vials with preservative    Face-to-face time spent  with patient was 30 minutes. 50% of time was spent in counseling and coordination of care.  Follow-Up Instructions: Return in about 4 months (around 12/04/2016) for Psoriatic arthritis.   Bo Merino, MD  Note - This record has been created using Editor, commissioning.  Chart creation errors have been sought, but may not always  have been located. Such creation errors do not reflect on  the standard of medical care.

## 2016-08-04 ENCOUNTER — Encounter: Payer: Self-pay | Admitting: Rheumatology

## 2016-08-04 ENCOUNTER — Ambulatory Visit (INDEPENDENT_AMBULATORY_CARE_PROVIDER_SITE_OTHER): Payer: BLUE CROSS/BLUE SHIELD | Admitting: Rheumatology

## 2016-08-04 VITALS — BP 138/78 | HR 74 | Resp 14 | Ht 70.0 in | Wt 225.0 lb

## 2016-08-04 DIAGNOSIS — Z79899 Other long term (current) drug therapy: Secondary | ICD-10-CM | POA: Diagnosis not present

## 2016-08-04 DIAGNOSIS — L409 Psoriasis, unspecified: Secondary | ICD-10-CM

## 2016-08-04 DIAGNOSIS — M545 Low back pain: Secondary | ICD-10-CM

## 2016-08-04 DIAGNOSIS — M461 Sacroiliitis, not elsewhere classified: Secondary | ICD-10-CM

## 2016-08-04 DIAGNOSIS — L4050 Arthropathic psoriasis, unspecified: Secondary | ICD-10-CM

## 2016-08-04 DIAGNOSIS — G8929 Other chronic pain: Secondary | ICD-10-CM

## 2016-08-04 DIAGNOSIS — E669 Obesity, unspecified: Secondary | ICD-10-CM

## 2016-08-04 DIAGNOSIS — Z6831 Body mass index (BMI) 31.0-31.9, adult: Secondary | ICD-10-CM

## 2016-08-04 MED ORDER — "TUBERCULIN-ALLERGY SYRINGES 27G X 1/2"" 1 ML KIT": 1.0000 | PACK | 3 refills | Status: DC

## 2016-08-04 MED ORDER — METHOTREXATE SODIUM CHEMO INJECTION 50 MG/2ML: INTRAMUSCULAR | 2 refills | Status: DC

## 2016-08-04 NOTE — Patient Instructions (Addendum)
Start taking methotrexate 0.8 mL subcutaneous weekly.  Continue folic acid 2 mg daily.  Stop taking oral methotrexate.   Standing Labs We placed an order today for your standing lab work.    Please come back and get your standing labs in 2 weeks then every 3 months to monitor for drug toxicity.  We have open lab Monday through Friday from 8:30-11:30 AM and 1:30-4 PM at the office of Dr. Arbutus PedShaili Lonnie Rosado/Naitik Panwala, PA.   The office is located at [address], [address], [place], [place] [address] No appointment is necessary.   Labs are drawn by [facility].  You may receive a bill from [facility] for your lab work. If you have any questions regarding directions or hours of operation,  please call [phone].

## 2016-08-04 NOTE — Progress Notes (Signed)
Rheumatology Medication Review by a Pharmacist Does the patient feel that his/her medications are working for him/her?  Yes; however patient continues to have some psoriasis Has the patient been experiencing any side effects to the medications prescribed?  No Does the patient have any problems obtaining medications?  No  Issues to address at subsequent visits: None   Pharmacist comments:  Patient is currently taking Cosentyx 300 mg every 4 weeks, methotrexate 6 tablets weekly, and folic acid 2 mg daily.  Most recent standing labs were normal on 05/27/16.  TB Gold negative 12/11/15.  Decision was made to change patient to subcutaneous methotrexate.  Educated patient on how to use a vial and syringe and reviewed injection technique with patient.  Patient was able to demonstrate proper technique for injections using vial and syringe.  Provided patient on educational material regarding injection technique and storage of methotrexate.  Patient instructed to stop oral methotrexate and start methotrexate 0.8 mL weekly.  He will need standing labs in 2 weeks.   Lilla Shookachel Maigan Bittinger, Pharm.D., BCPS, CPP Clinical Pharmacist Pager: [phone] Phone: [phone] 08/04/2016 1:55 PM

## 2016-08-24 ENCOUNTER — Other Ambulatory Visit: Payer: Self-pay | Admitting: *Deleted

## 2016-08-24 DIAGNOSIS — Z79899 Other long term (current) drug therapy: Secondary | ICD-10-CM

## 2016-08-24 LAB — CBC WITH DIFFERENTIAL/PLATELET
BASOS PCT: 0 %
Basophils Absolute: 0 cells/uL (ref 0–200)
EOS ABS: 144 {cells}/uL (ref 15–500)
Eosinophils Relative: 2 %
HEMATOCRIT: 42.3 % (ref 38.5–50.0)
HEMOGLOBIN: 14.6 g/dL (ref 13.2–17.1)
Lymphocytes Relative: 28 %
Lymphs Abs: 2016 cells/uL (ref 850–3900)
MCH: 31.4 pg (ref 27.0–33.0)
MCHC: 34.5 g/dL (ref 32.0–36.0)
MCV: 91 fL (ref 80.0–100.0)
MONO ABS: 648 {cells}/uL (ref 200–950)
MPV: 11 fL (ref 7.5–12.5)
Monocytes Relative: 9 %
NEUTROS ABS: 4392 {cells}/uL (ref 1500–7800)
Neutrophils Relative %: 61 %
Platelets: 227 10*3/uL (ref 140–400)
RBC: 4.65 MIL/uL (ref 4.20–5.80)
RDW: 14.2 % (ref 11.0–15.0)
WBC: 7.2 10*3/uL (ref 3.8–10.8)

## 2016-08-25 LAB — COMPLETE METABOLIC PANEL WITH GFR
ALBUMIN: 3.8 g/dL (ref 3.6–5.1)
ALT: 36 U/L (ref 9–46)
AST: 26 U/L (ref 10–35)
Alkaline Phosphatase: 69 U/L (ref 40–115)
BILIRUBIN TOTAL: 0.5 mg/dL (ref 0.2–1.2)
BUN: 10 mg/dL (ref 7–25)
CALCIUM: 8.8 mg/dL (ref 8.6–10.3)
CO2: 24 mmol/L (ref 20–31)
Chloride: 105 mmol/L (ref 98–110)
Creat: 0.94 mg/dL (ref 0.70–1.33)
GFR, Est Non African American: >89 mL/min (ref >=60)
Glucose, Bld: 95 mg/dL (ref 65–99)
Potassium: 4.2 mmol/L (ref 3.5–5.3)
Sodium: 140 mmol/L (ref 135–146)
TOTAL PROTEIN: 6.4 g/dL (ref 6.1–8.1)

## 2016-08-25 NOTE — Progress Notes (Signed)
stable °

## 2016-09-23 ENCOUNTER — Other Ambulatory Visit: Payer: Self-pay | Admitting: Rheumatology

## 2016-09-23 NOTE — Telephone Encounter (Signed)
08/04/16 last visit  12/06/16 next visit  TB gold negative on 12/24/15  CBC Latest Ref Rng & Units 08/24/2016 05/27/2016 03/31/2016  WBC 3.8 - 10.8 K/uL 7.2 6.6 7.6  Hemoglobin 13.2 - 17.1 g/dL 32.9 51.8 84.1  Hematocrit 38.5 - 50.0 % 42.3 43.0 44.8  Platelets 140 - 400 K/uL 227 241 237   CMP Latest Ref Rng & Units 08/24/2016 05/27/2016 03/31/2016  Glucose 65 - 99 mg/dL 95 660(Y) 82  BUN 7 - 25 mg/dL 10 11 12   Creatinine 0.70 - 1.33 mg/dL 3.01 6.01 0.93  Sodium 135 - 146 mmol/L 140 140 140  Potassium 3.5 - 5.3 mmol/L 4.2 4.9 4.4  Chloride 98 - 110 mmol/L 105 106 103  CO2 20 - 31 mmol/L 24 28 27   Calcium 8.6 - 10.3 mg/dL 8.8 9.5 9.3  Total Protein 6.1 - 8.1 g/dL 6.4 6.8 7.5  Total Bilirubin 0.2 - 1.2 mg/dL 0.5 0.6 0.5  Alkaline Phos 40 - 115 U/L 69 65 66  AST 10 - 35 U/L 26 16 22   ALT 9 - 46 U/L 36 21 28   Ok to refill per Dr Corliss Skains

## 2016-10-28 ENCOUNTER — Other Ambulatory Visit: Payer: Self-pay

## 2016-10-28 DIAGNOSIS — Z79899 Other long term (current) drug therapy: Secondary | ICD-10-CM

## 2016-10-28 LAB — CBC WITH DIFFERENTIAL/PLATELET
Basophils Absolute: 28 cells/uL (ref 0–200)
Basophils Relative: 0.4 %
EOS ABS: 119 {cells}/uL (ref 15–500)
Eosinophils Relative: 1.7 %
HCT: 42.1 % (ref 38.5–50.0)
Hemoglobin: 14.7 g/dL (ref 13.2–17.1)
Lymphs Abs: 2177 cells/uL (ref 850–3900)
MCH: 31.3 pg (ref 27.0–33.0)
MCHC: 34.9 g/dL (ref 32.0–36.0)
MCV: 89.8 fL (ref 80.0–100.0)
MONOS PCT: 9.9 %
MPV: 11.9 fL (ref 7.5–12.5)
NEUTROS PCT: 56.9 %
Neutro Abs: 3983 cells/uL (ref 1500–7800)
Platelets: 206 10*3/uL (ref 140–400)
RBC: 4.69 10*6/uL (ref 4.20–5.80)
RDW: 13.3 % (ref 11.0–15.0)
TOTAL LYMPHOCYTE: 31.1 %
WBC mixed population: 693 cells/uL (ref 200–950)
WBC: 7 10*3/uL (ref 3.8–10.8)

## 2016-10-28 LAB — COMPLETE METABOLIC PANEL WITH GFR
AG Ratio: 1.7 (calc) (ref 1.0–2.5)
ALBUMIN MSPROF: 4.2 g/dL (ref 3.6–5.1)
ALKALINE PHOSPHATASE (APISO): 67 U/L (ref 40–115)
ALT: 31 U/L (ref 9–46)
AST: 20 U/L (ref 10–35)
BILIRUBIN TOTAL: 0.5 mg/dL (ref 0.2–1.2)
BUN: 13 mg/dL (ref 7–25)
CHLORIDE: 103 mmol/L (ref 98–110)
CO2: 28 mmol/L (ref 20–32)
Calcium: 8.8 mg/dL (ref 8.6–10.3)
Creat: 0.99 mg/dL (ref 0.70–1.33)
GFR, Est African American: 102 mL/min/{1.73_m2} (ref >=60)
GFR, Est Non African American: 88 mL/min/{1.73_m2} (ref >=60)
GLUCOSE: 95 mg/dL (ref 65–99)
Globulin: 2.5 g/dL (calc) (ref 1.9–3.7)
Potassium: 4.3 mmol/L (ref 3.5–5.3)
Sodium: 138 mmol/L (ref 135–146)
Total Protein: 6.7 g/dL (ref 6.1–8.1)

## 2016-10-29 NOTE — Progress Notes (Signed)
wnl

## 2016-11-23 NOTE — Progress Notes (Signed)
Office Visit Note  Patient: Michael Peck             Date of Birth: 01-28-1966           MRN: [id number]             PCP: Jerl Mina, MD Referring: Jerl Mina, MD Visit Date: 12/06/2016 Occupation: @GUAROCC @    Subjective:  Psoriasis, LBP.   History of Present Illness: Michael Peck is a 51 y.o. male with history of psoriatic arthritis and psoriasis. He was switched to subcutaneous methotrexate last visit along with Cosyntex. He has noticed improvement in his symptoms. He missed 1 dose of methotrexate and noticed some recurrence of psoriasis on his bilateral lower extremities. He also has noticed some discomfort in the SI joint area. He has tried TENS unit which is been helpful. He denies any joint swelling.   Activities of Daily Living:  Patient reports morning stiffness for 15 minutes.   Patient Denies nocturnal pain.  Difficulty dressing/grooming: Denies Difficulty climbing stairs: Denies Difficulty getting out of chair: Denies Difficulty using hands for taps, buttons, cutlery, and/or writing: Denies   Review of Systems  Constitutional: Negative for fatigue, night sweats and weakness ( ).  HENT: Negative for mouth sores, mouth dryness and nose dryness.   Eyes: Negative for redness and dryness.  Respiratory: Negative for shortness of breath and difficulty breathing.   Cardiovascular: Negative for chest pain, palpitations, hypertension, irregular heartbeat and swelling in legs/feet.  Gastrointestinal: Negative for constipation and diarrhea.  Endocrine: Negative for increased urination.  Musculoskeletal: Positive for arthralgias, joint pain and morning stiffness. Negative for joint swelling, myalgias, muscle weakness, muscle tenderness and myalgias.  Skin: Positive for rash. Negative for color change, hair loss, nodules/bumps, skin tightness, ulcers and sensitivity to sunlight.       Psoriasis  Allergic/Immunologic: Negative for susceptible to infections.  Neurological:  Negative for dizziness, fainting, memory loss and night sweats.  Hematological: Negative for swollen glands.  Psychiatric/Behavioral: Negative for depressed mood and sleep disturbance. The patient is not nervous/anxious.     PMFS History:  Patient Active Problem List   Diagnosis Date Noted  . High risk medication use 12/10/2015  . Low back pain 12/10/2015  . Obesity 12/10/2015  . Psoriasis 12/09/2015  . Psoriatic arthritis ([facility]) 12/09/2015    Past Medical History:  Diagnosis Date  . Arthritis   . Psoriasis 12/09/2015  . Psoriatic arthritis ([facility]) 12/09/2015   Failed Enbrel    History reviewed. No pertinent family history. Past Surgical History:  Procedure Laterality Date  . ADENOIDECTOMY     Social History   Social History Narrative  . No narrative on file     Objective: Vital Signs: BP (!) 153/91 (BP Location: Left Arm, Patient Position: Sitting, Cuff Size: Large)   Pulse 67   Resp 14   Wt 222 lb (100.7 kg)   BMI 31.85 kg/m    Physical Exam  Constitutional: He is oriented to person, place, and time. He appears well-developed and well-nourished.  HENT:  Head: Normocephalic and atraumatic.  Eyes: Pupils are equal, round, and reactive to light. Conjunctivae and EOM are normal.  Neck: Normal range of motion. Neck supple.  Cardiovascular: Normal rate, regular rhythm and normal heart sounds.   Pulmonary/Chest: Effort normal and breath sounds normal.  Abdominal: Soft. Bowel sounds are normal.  Neurological: He is alert and oriented to person, place, and time.  Skin: Skin is warm and dry. Capillary refill takes less than 2 seconds. Rash  noted.  Psoriasis patches on bilateral shin area. Few dry scales on bilateral elbows.  Psychiatric: He has a normal mood and affect. His behavior is normal.  Nursing note and vitals reviewed.    Musculoskeletal Exam: C-spine and thoracic lumbar spine good range of motion. Shoulder joints elbow joints are good range of motion. He has  arthritis mutilans in bilateral hands but no active synovitis. Hip joints knee joints ankles MTPs PIPs DIPs with good range of motion with no synovitis.  CDAI Exam: CDAI Homunculus Exam:   Joint Counts:  CDAI Tender Joint count: 0 CDAI Swollen Joint count: 0  Global Assessments:  Patient Global Assessment: 2 Provider Global Assessment: 2  CDAI Calculated Score: 4    Investigation: No additional findings.TB Gold: Negative in 12/2015 CBC Latest Ref Rng & Units 10/28/2016 08/24/2016 05/27/2016  WBC 3.8 - 10.8 Thousand/uL 7.0 7.2 6.6  Hemoglobin 13.2 - 17.1 g/dL 69.6 29.5 28.4  Hematocrit 38.5 - 50.0 % 42.1 42.3 43.0  Platelets 140 - 400 Thousand/uL 206 227 241   CMP Latest Ref Rng & Units 10/28/2016 08/24/2016 05/27/2016  Glucose 65 - 99 mg/dL 95 95 132(G)  BUN 7 - 25 mg/dL 13 10 11   Creatinine 0.70 - 1.33 mg/dL 4.01 0.27 2.53  Sodium 135 - 146 mmol/L 138 140 140  Potassium 3.5 - 5.3 mmol/L 4.3 4.2 4.9  Chloride 98 - 110 mmol/L 103 105 106  CO2 20 - 32 mmol/L 28 24 28   Calcium 8.6 - 10.3 mg/dL 8.8 8.8 9.5  Total Protein 6.1 - 8.1 g/dL 6.7 6.4 6.8  Total Bilirubin 0.2 - 1.2 mg/dL 0.5 0.5 0.6  Alkaline Phos 40 - 115 U/L - 69 65  AST 10 - 35 U/L 20 26 16   ALT 9 - 46 U/L 31 36 21    Imaging: No results found.  Speciality Comments: No specialty comments available.    Procedures:  No procedures performed Allergies: Patient has no known allergies.   Assessment / Plan:     Visit Diagnoses: Psoriatic arthritis ([facility]) - erosive disease with dactylitis and sacroiliitisIn the past. He had no synovitis on examination today. 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 He is doing much better on Cosyntex and methotrexate combination.  Psoriasis: He had few psoriasis lesions on his bilateral lower extremities and his elbows.  Sacroiliitis [facility]): Improved with occasional lower back pain.  High risk medication use - Cosentyx 300 mg sq q month, Methotrexate 0.8 ml sq q wk, Folic acid 2 mg po qd.  (FAILED ENBREL & Humira). - Plan: CBC with Differential/Platelet, COMPLETE METABOLIC PANEL WITH GFR, Quantiferon tb gold assay (blood) elevated in December and then every 3 months for CBC and CMP  Chronic midline low back pain without sciatica: Off-and-on discomfort.  History of obesity: Weight loss diet and exercise was discussed at length.  Essential hypertension : His blood pressure was elevated today. He states his blood pressure has been running about 150 systolic at home. He has an appointment with his PCP in couple of months. I've given him prescription for amlodipine 5 mg by mouth daily. He will continue to monitor his pressure and follow up with his PCP.   Orders: Orders Placed This Encounter  Procedures  . CBC with Differential/Platelet  . COMPLETE METABOLIC PANEL WITH GFR  . Quantiferon tb gold assay (blood)   Meds ordered this encounter  Medications  . methotrexate 50 MG/2ML injection    Sig: Inject 0.8 mL subcutaneous weekly    Dispense:  10  mL    Refill:  0    Please dispense 2 mL vials with preservative  . amLODipine (NORVASC) 5 MG tablet    Sig: Take 1 tablet (5 mg total) by mouth daily.    Dispense:  30 tablet    Refill:  3    Face-to-face time spent with patient was 30 minutes. Greater than 50% of time was spent in counseling and coordination of care.  Follow-Up Instructions: Return in about 5 months (around 05/06/2017) for Psoriatic arthritis, Psoriasis.   Pollyann SavoyShaili Maleigh Bagot, MD  Note - This record has been created using Animal nutritionistDragon software.  Chart creation errors have been sought, but may not always  have been located. Such creation errors do not reflect on  the standard of medical care.

## 2016-12-06 ENCOUNTER — Encounter: Payer: Self-pay | Admitting: Rheumatology

## 2016-12-06 ENCOUNTER — Ambulatory Visit (INDEPENDENT_AMBULATORY_CARE_PROVIDER_SITE_OTHER): Payer: BLUE CROSS/BLUE SHIELD | Admitting: Rheumatology

## 2016-12-06 VITALS — BP 153/91 | HR 67 | Resp 14 | Wt 222.0 lb

## 2016-12-06 DIAGNOSIS — L409 Psoriasis, unspecified: Secondary | ICD-10-CM

## 2016-12-06 DIAGNOSIS — Z79899 Other long term (current) drug therapy: Secondary | ICD-10-CM | POA: Diagnosis not present

## 2016-12-06 DIAGNOSIS — G8929 Other chronic pain: Secondary | ICD-10-CM | POA: Diagnosis not present

## 2016-12-06 DIAGNOSIS — M461 Sacroiliitis, not elsewhere classified: Secondary | ICD-10-CM | POA: Diagnosis not present

## 2016-12-06 DIAGNOSIS — Z8639 Personal history of other endocrine, nutritional and metabolic disease: Secondary | ICD-10-CM

## 2016-12-06 DIAGNOSIS — I10 Essential (primary) hypertension: Secondary | ICD-10-CM

## 2016-12-06 DIAGNOSIS — M545 Low back pain, unspecified: Secondary | ICD-10-CM

## 2016-12-06 DIAGNOSIS — L4050 Arthropathic psoriasis, unspecified: Secondary | ICD-10-CM

## 2016-12-06 MED ORDER — METHOTREXATE SODIUM CHEMO INJECTION 50 MG/2ML: INTRAMUSCULAR | 0 refills | Status: DC

## 2016-12-06 MED ORDER — AMLODIPINE BESYLATE 5 MG PO TABS: 5.0000 mg | ORAL_TABLET | Freq: Every day | ORAL | 3 refills | Status: DC

## 2016-12-06 NOTE — Patient Instructions (Signed)
Standing Labs We placed an order today for your standing lab work.    Please come back and get your standing labs in December and every 3 months TB Gold with next lab work.  We have open lab Monday through Friday from 8:30-11:30 AM and 1:30-4 PM at the office of Dr. Pollyann SavoyShaili Alizeh Madril.   The office is located at [address], [address], [place], [place] [address] No appointment is necessary.   Labs are drawn by [facility].  You may receive a bill from [facility] for your lab work. If you have any questions regarding directions or hours of operation,  please call [phone].

## 2017-01-02 ENCOUNTER — Other Ambulatory Visit: Payer: Self-pay | Admitting: Rheumatology

## 2017-01-02 NOTE — Telephone Encounter (Signed)
Last Visit: 12/06/16 Next Visit: 05/09/17 Labs: 10/28/16 WNL TB Gold: 12/21/15 Neg   Okay to refill per Dr. Corliss Skainseveshwar

## 2017-04-07 ENCOUNTER — Other Ambulatory Visit: Payer: Self-pay

## 2017-04-07 DIAGNOSIS — Z79899 Other long term (current) drug therapy: Secondary | ICD-10-CM

## 2017-04-07 LAB — CBC WITH DIFFERENTIAL/PLATELET
Basophils Absolute: 40 cells/uL (ref 0–200)
Basophils Relative: 0.5 %
EOS PCT: 2.4 %
Eosinophils Absolute: 192 cells/uL (ref 15–500)
HCT: 47.7 % (ref 38.5–50.0)
Hemoglobin: 16.3 g/dL (ref 13.2–17.1)
Lymphs Abs: 2440 cells/uL (ref 850–3900)
MCH: 30.5 pg (ref 27.0–33.0)
MCHC: 34.2 g/dL (ref 32.0–36.0)
MCV: 89.2 fL (ref 80.0–100.0)
MPV: 11.7 fL (ref 7.5–12.5)
Monocytes Relative: 8.8 %
NEUTROS PCT: 57.8 %
Neutro Abs: 4624 cells/uL (ref 1500–7800)
PLATELETS: 265 10*3/uL (ref 140–400)
RBC: 5.35 10*6/uL (ref 4.20–5.80)
RDW: 13 % (ref 11.0–15.0)
TOTAL LYMPHOCYTE: 30.5 %
WBC mixed population: 704 cells/uL (ref 200–950)
WBC: 8 10*3/uL (ref 3.8–10.8)

## 2017-04-07 LAB — COMPLETE METABOLIC PANEL WITH GFR
AG Ratio: 1.6 (calc) (ref 1.0–2.5)
ALT: 20 U/L (ref 9–46)
AST: 16 U/L (ref 10–35)
Albumin: 4.4 g/dL (ref 3.6–5.1)
Alkaline phosphatase (APISO): 78 U/L (ref 40–115)
BUN: 12 mg/dL (ref 7–25)
CALCIUM: 9.5 mg/dL (ref 8.6–10.3)
CHLORIDE: 103 mmol/L (ref 98–110)
CO2: 28 mmol/L (ref 20–32)
CREATININE: 1.09 mg/dL (ref 0.70–1.33)
GFR, EST AFRICAN AMERICAN: 91 mL/min/{1.73_m2} (ref >=60)
GFR, EST NON AFRICAN AMERICAN: 79 mL/min/{1.73_m2} (ref >=60)
GLUCOSE: 115 mg/dL — AB (ref 65–99)
Globulin: 2.7 g/dL (calc) (ref 1.9–3.7)
Potassium: 4.3 mmol/L (ref 3.5–5.3)
Sodium: 139 mmol/L (ref 135–146)
TOTAL PROTEIN: 7.1 g/dL (ref 6.1–8.1)
Total Bilirubin: 0.5 mg/dL (ref 0.2–1.2)

## 2017-04-10 ENCOUNTER — Other Ambulatory Visit: Payer: Self-pay | Admitting: Rheumatology

## 2017-04-10 DIAGNOSIS — Z9225 Personal history of immunosupression therapy: Secondary | ICD-10-CM

## 2017-04-10 NOTE — Progress Notes (Signed)
WNLs. Glu is mildly elevated probably not fasting.

## 2017-04-10 NOTE — Telephone Encounter (Addendum)
Last Visit: 12/06/16 Next Visit: 05/09/17 Labs: 04/07/17 elevated glucose, rest WNL TB Gold:  12/11/15 Neg   Patient will update TB Gold on 04/12/17.  Okay to refill 30 day supply per Dr. Corliss Skainseveshwar

## 2017-04-13 ENCOUNTER — Other Ambulatory Visit: Payer: Self-pay

## 2017-04-13 DIAGNOSIS — Z9225 Personal history of immunosupression therapy: Secondary | ICD-10-CM

## 2017-04-14 ENCOUNTER — Other Ambulatory Visit: Payer: Self-pay | Admitting: *Deleted

## 2017-04-14 MED ORDER — AMLODIPINE BESYLATE 5 MG PO TABS: 5.0000 mg | ORAL_TABLET | Freq: Every day | ORAL | 3 refills | Status: DC

## 2017-04-14 NOTE — Telephone Encounter (Signed)
Refill request received via fax  Last Visit: 12/06/16 Next Visit: 05/09/17  Okay to refill per Dr. Corliss Skainseveshwar

## 2017-04-15 LAB — QUANTIFERON-TB GOLD PLUS
Mitogen-NIL: 9.63 IU/mL
NIL: 0.02 IU/mL
QUANTIFERON-TB GOLD PLUS: NEGATIVE
TB1-NIL: 0.03 IU/mL
TB2-NIL: 0.04 IU/mL

## 2017-04-16 ENCOUNTER — Other Ambulatory Visit: Payer: Self-pay | Admitting: Rheumatology

## 2017-04-17 NOTE — Telephone Encounter (Signed)
Last Visit: 12/06/16 Next Visit: 05/09/17  Okay to refill per Dr. Corliss Skainseveshwar

## 2017-04-26 NOTE — Progress Notes (Signed)
Office Visit Note  Patient: Michael Peck             Date of Birth: 07-01-1965           MRN: [id number]             PCP: Jerl MinaHedrick, James, MD Referring: Jerl MinaHedrick, James, MD Visit Date: 05/09/2017 Occupation: @GUAROCC @    Subjective:  Psoriasis    History of Present Illness: Michael Peck is a 52 y.o. male with history of psoriatic arthritis and psoriasis.  Patient states that he takes consent takes 300 mg subcu every month and has not missed any doses.  He states that he has been off of his methotrexate 0.8 mL subcu every week and folic acid 2 mg daily for about 2 months.  He states that he has not noticed a flare in his joints but he has has more psoriasis on his bilateral shins and ear canals.  He states that he plans to restart methotrexate this week.  He denies any joint pain or joint swelling at this time.  He denies any plantar fasciitis or Achilles tendinitis.  He denies any SI joint pain.   Activities of Daily Living:  Patient reports morning stiffness for 5-10 minutes.   Patient Denies nocturnal pain.  Difficulty dressing/grooming: Denies Difficulty climbing stairs: Denies Difficulty getting out of chair: Denies Difficulty using hands for taps, buttons, cutlery, and/or writing: Denies   Review of Systems  Constitutional: Positive for fatigue. Negative for night sweats.  HENT: Negative for mouth sores, mouth dryness and nose dryness.   Eyes: Negative for redness and dryness.  Respiratory: Negative for shortness of breath and difficulty breathing.   Cardiovascular: Negative for chest pain, palpitations, hypertension, irregular heartbeat and swelling in legs/feet.  Gastrointestinal: Negative for abdominal pain, constipation, diarrhea and nausea.  Endocrine: Negative for increased urination.  Genitourinary: Negative for painful urination and pelvic pain.  Musculoskeletal: Positive for morning stiffness. Negative for arthralgias, joint pain, joint swelling, myalgias, muscle  weakness, muscle tenderness and myalgias.  Skin: Positive for rash. Negative for color change, hair loss, nodules/bumps, skin tightness, ulcers and sensitivity to sunlight.  Allergic/Immunologic: Negative for susceptible to infections.  Neurological: Positive for headaches and weakness. Negative for dizziness, fainting, memory loss and night sweats.  Hematological: Negative for bruising/bleeding tendency and swollen glands.  Psychiatric/Behavioral: Negative for depressed mood, confusion and sleep disturbance. The patient is not nervous/anxious.     PMFS History:  Patient Active Problem List   Diagnosis Date Noted  . High risk medication use 12/10/2015  . Low back pain 12/10/2015  . Obesity 12/10/2015  . Psoriasis 12/09/2015  . Psoriatic arthritis (HCC) 12/09/2015    Past Medical History:  Diagnosis Date  . Arthritis   . Psoriasis 12/09/2015  . Psoriatic arthritis (HCC) 12/09/2015   Failed Enbrel    Family History  Problem Relation Age of Onset  . Epilepsy Mother   . Cancer Father        prostate   . Healthy Daughter    Past Surgical History:  Procedure Laterality Date  . ADENOIDECTOMY     Social History   Social History Narrative  . Not on file     Objective: Vital Signs: BP 131/80 (BP Location: Left Arm, Patient Position: Sitting, Cuff Size: Normal)   Pulse 62   Resp 17   Ht 5\' 10"  (1.778 m)   Wt 223 lb 8 oz (101.4 kg)   BMI 32.07 kg/m    Physical Exam  Constitutional: He  is oriented to person, place, and time. He appears well-developed and well-nourished.  HENT:  Head: Normocephalic and atraumatic.  Eyes: Pupils are equal, round, and reactive to light. Conjunctivae and EOM are normal.  Neck: Normal range of motion. Neck supple.  Cardiovascular: Normal rate, regular rhythm and normal heart sounds.  Pulmonary/Chest: Effort normal and breath sounds normal.  Abdominal: Soft. Bowel sounds are normal.  Neurological: He is alert and oriented to person, place, and  time.  Skin: Skin is warm and dry. Capillary refill takes less than 2 seconds.  Nail pitting of all fingernails. Onycholysis of several toenails. Psoriasis patch on extensor surface of right elbow and anterior surface of bilateral lower extremities.   Psychiatric: He has a normal mood and affect. His behavior is normal.  Nursing note and vitals reviewed.    Musculoskeletal Exam: C-spine, thoracic spine, lumbar spine good range of motion.  No midline spinal tenderness.  No SI joint tenderness.  Shoulder joints, elbow joints, wrist joints good ROM with no synovitis.  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  He has no pitting in all of his fingernails.  Hip joints, knee joints, ankle joints, MTPs, PIPs, DIPs good range of motion with no synovitis.  He has PIP and DIP synovial thickening. No dactylitis noted. No Achilles tendinitis or plantar fasciitis no tenderness of trochanteric bursa.  No warmth or effusion of bilateral knees.  No knee crepitus.  CDAI Exam: CDAI Homunculus Exam:   Joint Counts:  CDAI Tender Joint count: 0 CDAI Swollen Joint count: 0  Global Assessments:  Patient Global Assessment: 3 Provider Global Assessment: 3  CDAI Calculated Score: 6    Investigation: No additional findings. TB Gold: 04/13/2017 Negative CBC Latest Ref Rng & Units 04/07/2017 10/28/2016 08/24/2016  WBC 3.8 - 10.8 Thousand/uL 8.0 7.0 7.2  Hemoglobin 13.2 - 17.1 g/dL 96.0 45.4 09.8  Hematocrit 38.5 - 50.0 % 47.7 42.1 42.3  Platelets 140 - 400 Thousand/uL 265 206 227   CMP Latest Ref Rng & Units 04/07/2017 10/28/2016 08/24/2016  Glucose 65 - 99 mg/dL 119(J) 95 95  BUN 7 - 25 mg/dL 12 13 10   Creatinine 0.70 - 1.33 mg/dL 4.78 2.95 6.21  Sodium 135 - 146 mmol/L 139 138 140  Potassium 3.5 - 5.3 mmol/L 4.3 4.3 4.2  Chloride 98 - 110 mmol/L 103 103 105  CO2 20 - 32 mmol/L 28 28 24   Calcium 8.6 - 10.3 mg/dL 9.5 8.8 8.8  Total Protein 6.1 - 8.1 g/dL 7.1 6.7 6.4  Total Bilirubin  0.2 - 1.2 mg/dL 0.5 0.5 0.5  Alkaline Phos 40 - 115 U/L - - 69  AST 10 - 35 U/L 16 20 26   ALT 9 - 46 U/L 20 31 36    Imaging: No results found.  Speciality Comments: No specialty comments available.    Procedures:  No procedures performed Allergies: Patient has no known allergies.   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  He has nail pitting in all fingernails and onycholysis of his toenails.  No sacroiliitis, plantar fasciitis, or achilles tendonitis.  He will continue on present takes 300 mg subcutaneous every month, methotrexate 0.8 mL every week, and folic acid 2 mg daily.  He has been off of his methotrexate for about 2 months he has not  noticed a flare in his joints but he has been having more psoriasis outbreaks.  He does not need any refills at this time.  Psoriasis: He has active psoriasi on the extensor surface of his right elbow and anterior surface of bilateral lower extremities.  He has no pitting in all of his fingernails.  Here he reports that his psoriasis flared when he discontinued his methotrexate 2 months ago.  He is going to restart the methotrexate this week.    High risk medication use - Cosentyx 300 mg sq q month, Methotrexate 0.8 ml sq q wk, Folic acid 2 mg po qd. (FAILED ENBREL & Humira).  He will return in June and every 3 months for CBC and CMP to monitor for drug toxicity.  TB gold was negative on 04/13/2017.  Sacroiliitis [facility]): No SI joint tenderness or pain today.  Other medical conditions are listed as follows:  History of hypertension  History of obesity    Orders: No orders of the defined types were placed in this encounter.  No orders of the defined types were placed in this encounter.    Follow-Up Instructions: Return in about 5 months (around 10/09/2017) for  Psoriatic arthritis.   Gearldine Bienenstock, PA-C   I examined and evaluated the patient with Sherron Ales PA.  Patient has no active synovitis on my examination today but he had psoriasis patches.  He will resume methotrexate as described above.  The plan of care was discussed as noted above.  Pollyann Savoy, MD Note - This record has been created using Animal nutritionist.  Chart creation errors have been sought, but may not always  have been located. Such creation errors do not reflect on  the standard of medical care.

## 2017-05-09 ENCOUNTER — Encounter: Payer: Self-pay | Admitting: Rheumatology

## 2017-05-09 ENCOUNTER — Ambulatory Visit (INDEPENDENT_AMBULATORY_CARE_PROVIDER_SITE_OTHER): Payer: BLUE CROSS/BLUE SHIELD | Admitting: Rheumatology

## 2017-05-09 VITALS — BP 131/80 | HR 62 | Resp 17 | Ht 70.0 in | Wt 223.5 lb

## 2017-05-09 DIAGNOSIS — L4050 Arthropathic psoriasis, unspecified: Secondary | ICD-10-CM

## 2017-05-09 DIAGNOSIS — M461 Sacroiliitis, not elsewhere classified: Secondary | ICD-10-CM

## 2017-05-09 DIAGNOSIS — Z79899 Other long term (current) drug therapy: Secondary | ICD-10-CM | POA: Diagnosis not present

## 2017-05-09 DIAGNOSIS — Z8679 Personal history of other diseases of the circulatory system: Secondary | ICD-10-CM

## 2017-05-09 DIAGNOSIS — L409 Psoriasis, unspecified: Secondary | ICD-10-CM

## 2017-05-09 DIAGNOSIS — Z8639 Personal history of other endocrine, nutritional and metabolic disease: Secondary | ICD-10-CM

## 2017-05-09 NOTE — Patient Instructions (Signed)
Standing Labs We placed an order today for your standing lab work.    Please come back and get your standing labs in June and every 3 months  We have open lab Monday through Friday from 8:30-11:30 AM and 1:30-4:00 PM  at the office of Dr. Shaili Deveshwar.   You may experience shorter wait times on Monday and Friday afternoons. The office is located at [address], [address], [place], [place] [address] No appointment is necessary.   Labs are drawn by [facility].  You may receive a bill from [facility] for your lab work. If you have any questions regarding directions or hours of operation,  please call [phone].    

## 2017-05-21 ENCOUNTER — Other Ambulatory Visit: Payer: Self-pay | Admitting: Rheumatology

## 2017-05-22 NOTE — Telephone Encounter (Signed)
Last Visit: 05/09/17 Next Visit: 10/11/17 Labs: 04/07/17 WNLs. Glu is mildly elevated   Okay to refill per Dr. Corliss Skainseveshwar

## 2017-06-07 ENCOUNTER — Other Ambulatory Visit: Payer: Self-pay | Admitting: Rheumatology

## 2017-06-07 NOTE — Telephone Encounter (Signed)
Last Visit: 05/09/17 Next Visit: 10/11/17 Labs: 04/07/17 WNLs. Glu is mildly elevated TB Gold: 04/13/17 Neg   Okay to refill per Dr. Corliss Skains

## 2017-07-24 ENCOUNTER — Other Ambulatory Visit: Payer: Self-pay

## 2017-07-24 DIAGNOSIS — Z79899 Other long term (current) drug therapy: Secondary | ICD-10-CM

## 2017-07-31 ENCOUNTER — Encounter: Payer: Self-pay | Admitting: Emergency Medicine

## 2017-07-31 ENCOUNTER — Other Ambulatory Visit: Payer: Self-pay

## 2017-07-31 ENCOUNTER — Emergency Department: Payer: BLUE CROSS/BLUE SHIELD

## 2017-07-31 ENCOUNTER — Emergency Department
Admission: EM | Admit: 2017-07-31 | Discharge: 2017-07-31 | Disposition: A | Discharge status: Home / Self Care | Payer: BLUE CROSS/BLUE SHIELD | Attending: Emergency Medicine | Admitting: Emergency Medicine

## 2017-07-31 DIAGNOSIS — R61 Generalized hyperhidrosis: Secondary | ICD-10-CM | POA: Diagnosis not present

## 2017-07-31 DIAGNOSIS — R0602 Shortness of breath: Secondary | ICD-10-CM | POA: Diagnosis not present

## 2017-07-31 DIAGNOSIS — I10 Essential (primary) hypertension: Secondary | ICD-10-CM | POA: Insufficient documentation

## 2017-07-31 DIAGNOSIS — I2510 Atherosclerotic heart disease of native coronary artery without angina pectoris: Secondary | ICD-10-CM | POA: Insufficient documentation

## 2017-07-31 DIAGNOSIS — R079 Chest pain, unspecified: Secondary | ICD-10-CM

## 2017-07-31 HISTORY — DX: Essential (primary) hypertension: I10

## 2017-07-31 LAB — BASIC METABOLIC PANEL
Anion gap: 8 (ref 5–15)
BUN: 16 mg/dL (ref 6–20)
CO2: 25 mmol/L (ref 22–32)
Calcium: 9.1 mg/dL (ref 8.9–10.3)
Chloride: 107 mmol/L (ref 101–111)
Creatinine, Ser: 1.1 mg/dL (ref 0.61–1.24)
GFR calc Af Amer: >60 mL/min (ref >60)
GLUCOSE: 92 mg/dL (ref 65–99)
POTASSIUM: 4.3 mmol/L (ref 3.5–5.1)
Sodium: 140 mmol/L (ref 135–145)

## 2017-07-31 LAB — TROPONIN I

## 2017-07-31 LAB — CBC
HEMATOCRIT: 42.6 % (ref 40.0–52.0)
HEMOGLOBIN: 14.8 g/dL (ref 13.0–18.0)
MCH: 31.4 pg (ref 26.0–34.0)
MCHC: 34.8 g/dL (ref 32.0–36.0)
MCV: 90.2 fL (ref 80.0–100.0)
Platelets: 233 10*3/uL (ref 150–440)
RBC: 4.72 MIL/uL (ref 4.40–5.90)
RDW: 13.5 % (ref 11.5–14.5)
WBC: 6.1 10*3/uL (ref 3.8–10.6)

## 2017-07-31 MED ORDER — ASPIRIN 81 MG PO CHEW
324.0000 mg | CHEWABLE_TABLET | Freq: Once | ORAL | Status: AC
  Administered 2017-07-31: 324 mg via ORAL
  Filled 2017-07-31: qty 4

## 2017-07-31 MED ORDER — IOPAMIDOL (ISOVUE-370) INJECTION 76%
75.0000 mL | Freq: Once | INTRAVENOUS | Status: AC | PRN
  Administered 2017-07-31: 75 mL via INTRAVENOUS
  Filled 2017-07-31: qty 75

## 2017-07-31 NOTE — ED Provider Notes (Addendum)
[facility] Emergency Department Provider Note  ____________________________________________  Time seen: Approximately 4:47 PM  I have reviewed the triage vital signs and the nursing notes.   HISTORY  Chief Complaint Chest Pain    HPI Michael Peck is a 52 y.o. male with a history of hypertension presenting with chest pain.  The patient reports that he was sitting at his computer at 1230 when he developed a very severe right-sided "stabbing" chest pain that lasted 5 to 10 minutes and was associated with diaphoresis and shortness of breath.  The pain was nonradiating and he did not have any associated palpitations, lightheadedness or syncope, nausea or vomiting.  This did occur after lunch but he did not eat a larger meal than usual, and did not have spicy food in his lungs, and had no associated acidic taste in his mouth or burping.  He then proceeded to have several more episodes that were more milder dull, and lasted only about 5 minutes each.  When he walked to the car, his symptoms worsened.  Walking from triage to the room, the patient did not have a recurrence of chest pain, and he is chest pain-free at this time.  She has never had a risk stratification study including stress test or cardiac catheterization.  SH: No tobacco or cocaine.  Past Medical History:  Diagnosis Date  . Arthritis   . Hypertension   . Psoriasis 12/09/2015  . Psoriatic arthritis (HCC) 12/09/2015   Failed Enbrel    Patient Active Problem List   Diagnosis Date Noted  . High risk medication use 12/10/2015  . Low back pain 12/10/2015  . Obesity 12/10/2015  . Psoriasis 12/09/2015  . Psoriatic arthritis (HCC) 12/09/2015    Past Surgical History:  Procedure Laterality Date  . ADENOIDECTOMY      Current Outpatient Rx  . Order #: [id number] Class: Normal  . Order #: [id number] Class: Normal  . Order #: [id number] Class: Normal  . Order #: [id number] Class: Normal  . Order #:  [id number] Class: Historical Med  . Order #: [id number] Class: Historical Med  . Order #: [id number] Class: Normal  . Order #: [id number] Class: Normal  . Order #: [id number] Class: Normal  . Order #: [id number] Class: Historical Med    Allergies Patient has no known allergies.  Family History  Problem Relation Age of Onset  . Epilepsy Mother   . Cancer Father        prostate   . Healthy Daughter     Social History Social History   Tobacco Use  . Smoking status: Never Smoker  . Smokeless tobacco: Never Used  Substance Use Topics  . Alcohol use: No  . Drug use: No    Review of Systems Constitutional: No fever/chills.  No lightheadedness or syncope.  Positive diaphoresis Eyes: No visual changes. ENT: No sore throat. No congestion or rhinorrhea. Cardiovascular: Positive stabbing right-sided chest pain. Denies palpitations. Respiratory: Positive shortness of breath.  No cough. Gastrointestinal: No abdominal pain.  No nausea, no vomiting.  No diarrhea.  No constipation. Genitourinary: Negative for dysuria. Musculoskeletal: Negative for back pain. Skin: Negative for rash. Neurological: Negative for headaches. No focal numbness, tingling or weakness.     ____________________________________________   PHYSICAL EXAM:  VITAL SIGNS: ED Triage Vitals  Enc Vitals Group     BP 07/31/17 1346 (!) 143/84     Pulse Rate 07/31/17 1346 66     Resp 07/31/17 1346 20     Temp 07/31/17 1346 98.9 F (37.2 C)  Temp Source 07/31/17 1346 Oral     SpO2 07/31/17 1346 97 %     Weight 07/31/17 1346 215 lb (97.5 kg)     Height 07/31/17 1346 5\' 10"  (1.778 m)     Head Circumference --      Peak Flow --      Pain Score 07/31/17 1350 2     Pain Loc --      Pain Edu? --      Excl. in GC? --     Constitutional: Alert and oriented. Answers questions appropriately. Eyes: Conjunctivae are normal.  EOMI. No scleral icterus. Head: Atraumatic. Nose: No congestion/rhinnorhea. Mouth/Throat: Mucous  membranes are moist.  Neck: No stridor.  Supple.  No JVD.  No meningismus. Cardiovascular: Normal rate, regular rhythm. No murmurs, rubs or gallops.  Respiratory: Normal respiratory effort.  No accessory muscle use or retractions. Lungs CTAB.  No wheezes, rales or ronchi. Gastrointestinal: Soft, nontender and nondistended.  No guarding or rebound.  No peritoneal signs. Musculoskeletal: No LE edema. No ttp in the calves or palpable cords.  Negative Homan's sign. Neurologic:  A&Ox3.  Speech is clear.  Face and smile are symmetric.  EOMI.  Moves all extremities well. Skin:  Skin is warm, dry and intact. No rash noted. Psychiatric: Mood and affect are normal. Speech and behavior are normal.  Normal judgement.  ____________________________________________   LABS (all labs ordered are listed, but only abnormal results are displayed)  Labs Reviewed  BASIC METABOLIC PANEL  CBC  TROPONIN I  TROPONIN I   ____________________________________________  EKG  ED ECG REPORT I, Rockne MenghiniNorman, Anne-Caroline, the attending physician, personally viewed and interpreted this ECG.   Date: 07/31/2017  EKG Time: 1345  Rate: 64  Rhythm: normal sinus rhythm; incomplete right bundle branch block.  Axis: leftward  Intervals:none  ST&T Change: No STEMI  Reviewed the patient's medical record and he does not have a prior EKG for comparison. ____________________________________________  RADIOLOGY  Dg Chest 2 View  Result Date: 07/31/2017 CLINICAL DATA:  Stabbing right-sided abdominal discomfort since yesterday. Such episodes are so she with shortness of breath and diaphoresis. History of hypertension and psoriasis. EXAM: CHEST - 2 VIEW COMPARISON:  None in PACs FINDINGS: The lungs are adequately inflated and clear. The heart and pulmonary vascularity are normal. The mediastinum is normal in width. There is no pleural effusion. The trachea is midline. The bony thorax exhibits no acute abnormality. IMPRESSION:  There is no active cardiopulmonary disease. Electronically Signed   By: David  SwazilandJordan M.D.   On: 07/31/2017 14:21   Ct Angio Chest Pe W And/or Wo Contrast  Result Date: 07/31/2017 CLINICAL DATA:  3-4 episodes of severe stabbing pain occurred while at work on the computer. EXAM: CT ANGIOGRAPHY CHEST WITH CONTRAST TECHNIQUE: Multidetector CT imaging of the chest was performed using the standard protocol during bolus administration of intravenous contrast. Multiplanar CT image reconstructions and MIPs were obtained to evaluate the vascular anatomy. CONTRAST:  75mL ISOVUE-370 IOPAMIDOL (ISOVUE-370) INJECTION 76% COMPARISON:  Same day CXR FINDINGS: Cardiovascular: Conventional branch pattern of the great vessels without significant stenosis. Nonaneurysmal thoracic aorta without evidence of dissection. Satisfactory opacification of the pulmonary arteries to the segmental level without acute pulmonary embolus. Heart size is top-normal. Minimal coronary arteriosclerosis along the distal LAD. Mediastinum/Nodes: No enlarged mediastinal, hilar, or axillary lymph nodes. Thyroid gland, trachea, and esophagus demonstrate no significant findings. Lungs/Pleura: Bibasilar dependent atelectasis. No effusion or pneumothorax. Acute pulmonary consolidation. No dominant mass. Upper Abdomen: No acute abnormality.  Musculoskeletal: No chest wall abnormality. No acute or significant osseous findings. Review of the MIP images confirms the above findings. IMPRESSION: 1. No acute pulmonary embolus, aortic aneurysm or dissection. 2. Minimal coronary arteriosclerosis along the LAD. Electronically Signed   By: Tollie Eth M.D.   On: 07/31/2017 17:40    ____________________________________________   PROCEDURES  Procedure(s) performed: None  Procedures  Critical Care performed: No ____________________________________________   INITIAL IMPRESSION / ASSESSMENT AND PLAN / ED COURSE  Pertinent labs & imaging results that were  available during my care of the patient were reviewed by me and considered in my medical decision making (see chart for details).  52 y.o. male with a history of hypertension presenting with several episodes of right-sided sharp chest pains associated with diaphoresis and shortness of breath.  Overall, the patient is mildly hypertensive at 143/84 but otherwise hemodynamically stable.  His EKG is not completely normal and does show a leftward axis deviation as well as incomplete right bundle branch block and left anterior fascicular block.  Unfortunately, I do not have any prior EKGs for comparison.  He does not have evidence of significant ischemia.  His initial troponin is negative but it was drawn very closely to the inciting event, and a second troponin is indicated today.  The patient's chest x-ray does not show any acute cardiopulmonary disease and his other laboratory studies are otherwise reassuring.  It is possible that the patient had a GI cause for his symptoms.  I do not see any evidence of DVT, he is not tachycardic or hypoxic, will get a CT angiogram to rule out PE today.  Aortic pathology is considered but very unlikely.  Plan reevaluation for final disposition.  ----------------------------------------- 5:48 PM on 07/31/2017 -----------------------------------------  The patient continues to be chest pain-free, even with ambulation, in the emergency department.  His repeat troponin is negative.  His CT scan does not show any PE.  He does have some LAD arteriosclerosis and I have spoken directly with Dr. Adrian Blackwater, who will see the patient at 9 AM tomorrow morning for reevaluation.  I had an extensive discussion with the patient about follow-up instructions as well as return precautions.  ____________________________________________  FINAL CLINICAL IMPRESSION(S) / ED DIAGNOSES  Final diagnoses:  Chest pain, unspecified type  Coronary arteriosclerosis  Diaphoresis  Shortness of  breath         NEW MEDICATIONS STARTED DURING THIS VISIT:  New Prescriptions   No medications on file      Rockne Menghini, MD 07/31/17 1749    Rockne Menghini, MD 07/31/17 1800

## 2017-07-31 NOTE — Discharge Instructions (Addendum)
Please make sure to follow up with Dr. Welton FlakesKhan, the cardiologist, at [facility]9am tomorrow.    Return immediately to the emergency department for chest pain, shortness of breath, sweaty or clammy feeling, lightheadedness or fainting, palpitations, or any other symptoms concerning to you.

## 2017-07-31 NOTE — ED Notes (Addendum)
Pt reports 3-4 episodes of severe stabbing pain that occurred while working on the computer. First episode was at 12:30pm. Reportedly broke out in a sweat at that time. Episodes lasted approx 5 mins each over an hour and a half span. States pain never returned to 0 during that time.

## 2017-07-31 NOTE — ED Notes (Signed)
Pt ambulatory upon discharge. Verbalized understanding of discharge instructions and follow-up care. VSS. Skin warm and dry. A&O x4.  

## 2017-07-31 NOTE — ED Triage Notes (Signed)
Right sided chest pain today , diaphoresis, increased pain on activity.

## 2017-08-24 ENCOUNTER — Other Ambulatory Visit: Payer: Self-pay | Admitting: Rheumatology

## 2017-08-24 NOTE — Telephone Encounter (Signed)
Last Visit: 05/09/17 Next Visit: 10/11/17 Labs: 07/24/17 Glucose is 101. All other labs are WNL  Okay to refill per Dr. Corliss Skainseveshwar

## 2017-09-11 ENCOUNTER — Other Ambulatory Visit: Payer: Self-pay | Admitting: Rheumatology

## 2017-09-11 NOTE — Telephone Encounter (Signed)
Last visit: 05/09/2017 Next visit: 10/11/2017 Labs: 07/24/2017 Glucose is 101. All other labs are WNL. Tb gold: 04/13/2017 negative   Okay to refill per Dr. Corliss Skainseveshwar.

## 2017-09-12 ENCOUNTER — Telehealth: Payer: Self-pay | Admitting: Pharmacy Technician

## 2017-09-12 NOTE — Telephone Encounter (Signed)
Received a Prior Authorization renewal  request from [employer] for Cosentyx. Authorization has been submitted to patient's insurance via Fax. Will update once we receive a response.  Will send document to scan center.  PA# [id number] Fax# [phone]  9:03 AM Dorthula Nettlesachael N Philippe Gang, CPhT

## 2017-09-15 ENCOUNTER — Telehealth: Payer: Self-pay | Admitting: Pharmacy Technician

## 2017-09-15 NOTE — Telephone Encounter (Signed)
Received a fax from [employer] [employer] regarding a prior authorization for Cosentyx. Authorization has been APPROVED from 09/13/2017 to 09/14/2019.   Will send document to scan center.  Authorization #[id number] Phone #[phone]  11:44 AM Dorthula Nettlesachael N Ahonesty Woodfin, CPhT

## 2017-09-27 NOTE — Progress Notes (Deleted)
   Office Visit Note  Patient: Michael LamasMark Felling             Date of Birth: January 01, 1966           MRN: [id number]             PCP: Jerl MinaHedrick, James, MD Referring: Jerl MinaHedrick, James, MD Visit Date: 10/11/2017 Occupation: @GUAROCC @  Subjective:  No chief complaint on file.   History of Present Illness: Michael Peck is a 52 y.o. male ***   Activities of Daily Living:  Patient reports morning stiffness for *** {minute/hour:19697}.   Patient {ACTIONS;DENIES/REPORTS:21021675::"Denies"} nocturnal pain.  Difficulty dressing/grooming: {ACTIONS;DENIES/REPORTS:21021675::"Denies"} Difficulty climbing stairs: {ACTIONS;DENIES/REPORTS:21021675::"Denies"} Difficulty getting out of chair: {ACTIONS;DENIES/REPORTS:21021675::"Denies"} Difficulty using hands for taps, buttons, cutlery, and/or writing: {ACTIONS;DENIES/REPORTS:21021675::"Denies"}  No Rheumatology ROS completed.   PMFS History:  Patient Active Problem List   Diagnosis Date Noted  . High risk medication use 12/10/2015  . Low back pain 12/10/2015  . Obesity 12/10/2015  . Psoriasis 12/09/2015  . Psoriatic arthritis (HCC) 12/09/2015    Past Medical History:  Diagnosis Date  . Arthritis   . Hypertension   . Psoriasis 12/09/2015  . Psoriatic arthritis (HCC) 12/09/2015   Failed Enbrel    Family History  Problem Relation Age of Onset  . Epilepsy Mother   . Cancer Father        prostate   . Healthy Daughter    Past Surgical History:  Procedure Laterality Date  . ADENOIDECTOMY     Social History   Social History Narrative  . Not on file    Objective: Vital Signs: There were no vitals taken for this visit.   Physical Exam   Musculoskeletal Exam: ***  CDAI Exam: CDAI Score: Not documented Patient Global Assessment: Not documented; Provider Global Assessment: Not documented Swollen: Not documented; Tender: Not documented Joint Exam   Not documented   There is currently no information documented on the homunculus. Go to the  Rheumatology activity and complete the homunculus joint exam.  Investigation: No additional findings.  Imaging: No results found.  Recent Labs: Lab Results  Component Value Date   WBC 6.1 07/31/2017   HGB 14.8 07/31/2017   PLT 233 07/31/2017   NA 140 07/31/2017   K 4.3 07/31/2017   CL 107 07/31/2017   CO2 25 07/31/2017   GLUCOSE 92 07/31/2017   BUN 16 07/31/2017   CREATININE 1.10 07/31/2017   BILITOT 0.7 07/24/2017   ALKPHOS 69 08/24/2016   AST 17 07/24/2017   ALT 22 07/24/2017   PROT 6.7 07/24/2017   ALBUMIN 3.8 08/24/2016   CALCIUM 9.1 07/31/2017   GFRAA >60 07/31/2017   QFTBGOLDPLUS NEGATIVE 04/13/2017    Speciality Comments: No specialty comments available.  Procedures:  No procedures performed Allergies: Patient has no known allergies.   Assessment / Plan:     Visit Diagnoses: No diagnosis found.   Orders: No orders of the defined types were placed in this encounter.  No orders of the defined types were placed in this encounter.   Face-to-face time spent with patient was *** minutes. Greater than 50% of time was spent in counseling and coordination of care.  Follow-Up Instructions: No follow-ups on file.   Ellen HenriMarissa C Andersen Iorio, CMA  Note - This record has been created using Animal nutritionistDragon software.  Chart creation errors have been sought, but may not always  have been located. Such creation errors do not reflect on  the standard of medical care.

## 2017-10-11 ENCOUNTER — Ambulatory Visit: Payer: BLUE CROSS/BLUE SHIELD | Admitting: Rheumatology

## 2017-10-16 NOTE — Progress Notes (Signed)
Office Visit Note  Patient: Michael Peck             Date of Birth: 08-29-1965           MRN: [id number]             PCP: Jerl Mina, MD Referring: Jerl Mina, MD Visit Date: 10/17/2017 Occupation: @GUAROCC @  Subjective:  Medication monitoring   History of Present Illness: Michael Peck is a 52 y.o. male with history of psoriatic arthritis.  Patient is on Cosentyx 300 mg subcutaneous injections every month, methotrexate 0.8 mL subcutaneously once weekly and folic acid 2 mg daily.  He has not missed any doses recently.  He states that he notices some increased joint tenderness 1 week prior to his Cosentyx injections.  He denies any joint swelling at this time.  He denies any recent flares.  He states that he has some tenderness in his left thumb.  He denies any plantar fasciitis or Achilles tendinitis.  He denies any SI joint pain at this time.  He denies any active psoriasis.   Activities of Daily Living:  Patient reports morning stiffness for  10 minutes.   Patient Denies nocturnal pain.  Difficulty dressing/grooming: Denies Difficulty climbing stairs: Denies Difficulty getting out of chair: Denies Difficulty using hands for taps, buttons, cutlery, and/or writing: Denies  Review of Systems  Constitutional: Positive for fatigue. Negative for night sweats.  HENT: Negative for mouth sores, mouth dryness and nose dryness.   Eyes: Negative for redness, visual disturbance and dryness.  Respiratory: Negative for cough, hemoptysis, shortness of breath and difficulty breathing.   Cardiovascular: Negative for chest pain, palpitations, hypertension, irregular heartbeat and swelling in legs/feet.  Gastrointestinal: Negative for blood in stool, constipation and diarrhea.  Endocrine: Negative for increased urination.  Genitourinary: Negative for painful urination.  Musculoskeletal: Positive for arthralgias, joint pain and morning stiffness. Negative for joint swelling, myalgias, muscle  weakness, muscle tenderness and myalgias.  Skin: Negative for color change, rash, hair loss, nodules/bumps, skin tightness, ulcers and sensitivity to sunlight.  Allergic/Immunologic: Negative for susceptible to infections.  Neurological: Negative for dizziness, fainting, memory loss, night sweats and weakness.  Hematological: Negative for swollen glands.  Psychiatric/Behavioral: Negative for depressed mood and sleep disturbance. The patient is not nervous/anxious.     PMFS History:  Patient Active Problem List   Diagnosis Date Noted  . High risk medication use 12/10/2015  . Low back pain 12/10/2015  . Obesity 12/10/2015  . Psoriasis 12/09/2015  . Psoriatic arthritis (HCC) 12/09/2015    Past Medical History:  Diagnosis Date  . Arthritis   . Hypertension   . Psoriasis 12/09/2015  . Psoriatic arthritis (HCC) 12/09/2015   Failed Enbrel    Family History  Problem Relation Age of Onset  . Epilepsy Mother   . Cancer Father        prostate   . Healthy Daughter    Past Surgical History:  Procedure Laterality Date  . ADENOIDECTOMY     Social History   Social History Narrative  . Not on file    Objective: Vital Signs: BP 137/86 (BP Location: Left Arm, Patient Position: Sitting, Cuff Size: Small)   Pulse 62   Resp 14   Ht 5\' 10"  (1.778 m)   Wt 216 lb 12.8 oz (98.3 kg)   BMI 31.11 kg/m    Physical Exam  Constitutional: He is oriented to person, place, and time. He appears well-developed and well-nourished.  HENT:  Head: Normocephalic and atraumatic.  Eyes: Pupils are equal, round, and reactive to light. Conjunctivae and EOM are normal.  Neck: Normal range of motion. Neck supple.  Cardiovascular: Normal rate, regular rhythm and normal heart sounds.  Pulmonary/Chest: Effort normal and breath sounds normal.  Abdominal: Soft. Bowel sounds are normal.  Lymphadenopathy:    He has no cervical adenopathy.  Neurological: He is alert and oriented to person, place, and time.    Skin: Skin is warm and dry. Capillary refill takes less than 2 seconds.  No active psoriasis at this time.  Nail pitting noted in all fingernails.  Psychiatric: He has a normal mood and affect. His behavior is normal.  Nursing note and vitals reviewed.    Musculoskeletal Exam: C-spine good range of motion with no discomfort.  Thoracic and lumbar spine good range of motion.  No midline spinal tenderness.  No SI joint tenderness.  Shoulder joints, elbow joints, wrist joints, MCPs, PIPs and DIPs good range of motion with no synovitis. 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  He has tenderness of his left first PIP joint.  He has PIP and DIP synovial thickening.  Hip joints good range of motion with no discomfort.  He has good range of motion bilateral knee joints.  No warmth or effusion noted.  He has bilateral knee crepitus.  No swelling or tenderness of ankle joints.  No achilles tendonitis or plantar fasciitis.  No tenderness of trochanteric bursa bilaterally.   CDAI Exam: CDAI Score: 0.6  Patient Global Assessment: 3 (mm); Provider Global Assessment: 3 (mm) Swollen: 0 ; Tender: 0  Joint Exam   Not documented   There is currently no information documented on the homunculus. Go to the Rheumatology activity and complete the homunculus joint exam.  Investigation: No additional findings.  Imaging: No results found.  Recent Labs: Lab Results  Component Value Date   WBC 6.1 07/31/2017   HGB 14.8 07/31/2017   PLT 233 07/31/2017   NA 140 07/31/2017   K 4.3 07/31/2017   CL 107 07/31/2017   CO2 25 07/31/2017   GLUCOSE 92 07/31/2017   BUN 16 07/31/2017   CREATININE 1.10 07/31/2017   BILITOT 0.7 07/24/2017   ALKPHOS 69 08/24/2016   AST 17 07/24/2017   ALT 22 07/24/2017   PROT 6.7 07/24/2017   ALBUMIN 3.8 08/24/2016   CALCIUM 9.1 07/31/2017   GFRAA >60 07/31/2017   QFTBGOLDPLUS NEGATIVE 04/13/2017    Speciality Comments: No specialty comments available.  Procedures:  No  procedures performed Allergies: Patient has no known allergies.   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 He has no synovitis or dactylitis on exam.  He has not had any recent flares.  He has tenderness in the left 1st PIP joint but no synovitis noted.  He has no SI joint tenderness.  He has no achilles tendonitis or plantar fasciitis.  He has no psoriasis at this time.  He is clinically doing well on Cosentyx 300 mg sq q month, MTX 0.8 ml sq qwk, and folic acid 2 mg daily.  A refill of folic acid was sent to the pharmacy.  He was advised to notify us if he develops increased joint pain or joint swelling.  He will return to the office in 5 months.    Psoriasis: He has no active psoriasis at this time.  He has nail pitting in all fingernails.  High risk medication use -  Cosentyx 300 mg sq q month, Methotrexate 0.8 ml sq q wk, Folic acid 2 mg po qd. (FAILED ENBREL & Humira).  CBC and CMP will be drawn today to monitor for drug toxicity.  He will return in December and every 3 months for lab work. Future orders are in place.  TB gold was negative on 04/13/2017.- Plan: CBC with Differential/Platelet, COMPLETE METABOLIC PANEL WITH GFR  Sacroiliitis (HCC): He has no SI joint tenderness at this time.   Other medical conditions are listed as follows:   History of obesity  History of hypertension   Orders: Orders Placed This Encounter  Procedures  . CBC with Differential/Platelet  . COMPLETE METABOLIC PANEL WITH GFR   Meds ordered this encounter  Medications  . folic acid (FOLVITE) 1 MG tablet    Sig: Take 2 tablets (2 mg total) by mouth daily.    Dispense:  180 tablet    Refill:  3     Follow-Up Instructions: Return in about 5 months (around 03/19/2018) for Psoriatic arthritis.   Gearldine Bienenstock, PA-C  Note - This record has been created using Dragon  software.  Chart creation errors have been sought, but may not always  have been located. Such creation errors do not reflect on  the standard of medical care.

## 2017-10-17 ENCOUNTER — Ambulatory Visit (INDEPENDENT_AMBULATORY_CARE_PROVIDER_SITE_OTHER): Payer: BLUE CROSS/BLUE SHIELD | Admitting: Physician Assistant

## 2017-10-17 ENCOUNTER — Encounter: Payer: Self-pay | Admitting: Physician Assistant

## 2017-10-17 VITALS — BP 137/86 | HR 62 | Resp 14 | Ht 70.0 in | Wt 216.8 lb

## 2017-10-17 DIAGNOSIS — Z79899 Other long term (current) drug therapy: Secondary | ICD-10-CM | POA: Diagnosis not present

## 2017-10-17 DIAGNOSIS — L409 Psoriasis, unspecified: Secondary | ICD-10-CM

## 2017-10-17 DIAGNOSIS — M461 Sacroiliitis, not elsewhere classified: Secondary | ICD-10-CM | POA: Diagnosis not present

## 2017-10-17 DIAGNOSIS — L4050 Arthropathic psoriasis, unspecified: Secondary | ICD-10-CM | POA: Diagnosis not present

## 2017-10-17 DIAGNOSIS — Z8679 Personal history of other diseases of the circulatory system: Secondary | ICD-10-CM

## 2017-10-17 DIAGNOSIS — Z8639 Personal history of other endocrine, nutritional and metabolic disease: Secondary | ICD-10-CM

## 2017-10-17 MED ORDER — FOLIC ACID 1 MG PO TABS: 2.0000 mg | ORAL_TABLET | Freq: Every day | ORAL | 3 refills | Status: DC

## 2017-10-17 NOTE — Patient Instructions (Signed)
Standing Labs We placed an order today for your standing lab work.    Please come back and get your standing labs in December and every 3 months   We have open lab Monday through Friday from 8:30-11:30 AM and 1:30-4:00 PM  at the office of Dr. Shaili Deveshwar.   You may experience shorter wait times on Monday and Friday afternoons. The office is located at [address], [address], [place], [place] [address] No appointment is necessary.   Labs are drawn by [facility].  You may receive a bill from [facility] for your lab work. If you have any questions regarding directions or hours of operation,  please call [phone].     

## 2017-10-18 LAB — CBC WITH DIFFERENTIAL/PLATELET
BASOS PCT: 0.7 %
Basophils Absolute: 48 cells/uL (ref 0–200)
EOS PCT: 2.5 %
Eosinophils Absolute: 173 cells/uL (ref 15–500)
HCT: 44.3 % (ref 38.5–50.0)
Hemoglobin: 15 g/dL (ref 13.2–17.1)
Lymphs Abs: 2215 cells/uL (ref 850–3900)
MCH: 30.9 pg (ref 27.0–33.0)
MCHC: 33.9 g/dL (ref 32.0–36.0)
MCV: 91.3 fL (ref 80.0–100.0)
MONOS PCT: 9.1 %
MPV: 11.8 fL (ref 7.5–12.5)
NEUTROS PCT: 55.6 %
Neutro Abs: 3836 cells/uL (ref 1500–7800)
PLATELETS: 221 10*3/uL (ref 140–400)
RBC: 4.85 10*6/uL (ref 4.20–5.80)
RDW: 13.1 % (ref 11.0–15.0)
TOTAL LYMPHOCYTE: 32.1 %
WBC mixed population: 628 cells/uL (ref 200–950)
WBC: 6.9 10*3/uL (ref 3.8–10.8)

## 2017-10-18 LAB — COMPLETE METABOLIC PANEL WITH GFR
AG RATIO: 1.8 (calc) (ref 1.0–2.5)
ALT: 15 U/L (ref 9–46)
AST: 15 U/L (ref 10–35)
Albumin: 4.3 g/dL (ref 3.6–5.1)
Alkaline phosphatase (APISO): 71 U/L (ref 40–115)
BUN: 15 mg/dL (ref 7–25)
CALCIUM: 9.1 mg/dL (ref 8.6–10.3)
CO2: 27 mmol/L (ref 20–32)
Chloride: 105 mmol/L (ref 98–110)
Creat: 1 mg/dL (ref 0.70–1.33)
GFR, EST NON AFRICAN AMERICAN: 87 mL/min/{1.73_m2} (ref >=60)
GFR, Est African American: 101 mL/min/{1.73_m2} (ref >=60)
GLUCOSE: 97 mg/dL (ref 65–99)
Globulin: 2.4 g/dL (calc) (ref 1.9–3.7)
POTASSIUM: 4.5 mmol/L (ref 3.5–5.3)
SODIUM: 141 mmol/L (ref 135–146)
TOTAL PROTEIN: 6.7 g/dL (ref 6.1–8.1)
Total Bilirubin: 0.3 mg/dL (ref 0.2–1.2)

## 2017-10-18 NOTE — Progress Notes (Signed)
Labs are WNL.

## 2017-11-03 ENCOUNTER — Other Ambulatory Visit: Payer: Self-pay | Admitting: Rheumatology

## 2017-11-03 NOTE — Telephone Encounter (Signed)
Last Visit: 10/17/17 Next Visit: 03/21/18  Okay to refill per Dr. Corliss Skains

## 2017-11-07 LAB — COMPLETE METABOLIC PANEL WITH GFR
AG Ratio: 1.8 (calc) (ref 1.0–2.5)
ALKALINE PHOSPHATASE (APISO): 67 U/L (ref 40–115)
ALT: 22 U/L (ref 9–46)
AST: 17 U/L (ref 10–35)
Albumin: 4.3 g/dL (ref 3.6–5.1)
BUN: 15 mg/dL (ref 7–25)
CALCIUM: 9.2 mg/dL (ref 8.6–10.3)
CO2: 27 mmol/L (ref 20–32)
CREATININE: 0.93 mg/dL (ref 0.70–1.33)
Chloride: 107 mmol/L (ref 98–110)
GFR, EST NON AFRICAN AMERICAN: 95 mL/min/{1.73_m2} (ref >=60)
GFR, Est African American: 110 mL/min/{1.73_m2} (ref >=60)
GLUCOSE: 101 mg/dL — AB (ref 65–99)
Globulin: 2.4 g/dL (calc) (ref 1.9–3.7)
Potassium: 4.2 mmol/L (ref 3.5–5.3)
Sodium: 142 mmol/L (ref 135–146)
Total Bilirubin: 0.7 mg/dL (ref 0.2–1.2)
Total Protein: 6.7 g/dL (ref 6.1–8.1)

## 2017-11-07 LAB — CBC WITH DIFFERENTIAL/PLATELET
BASOS PCT: 0.5 %
Basophils Absolute: 32 cells/uL (ref 0–200)
EOS ABS: 120 {cells}/uL (ref 15–500)
Eosinophils Relative: 1.9 %
HEMATOCRIT: 41.3 % (ref 38.5–50.0)
HEMOGLOBIN: 14.4 g/dL (ref 13.2–17.1)
LYMPHS ABS: 2268 {cells}/uL (ref 850–3900)
MCH: 31 pg (ref 27.0–33.0)
MCHC: 34.9 g/dL (ref 32.0–36.0)
MCV: 89 fL (ref 80.0–100.0)
MPV: 11.2 fL (ref 7.5–12.5)
Monocytes Relative: 10.7 %
NEUTROS ABS: 3207 {cells}/uL (ref 1500–7800)
Neutrophils Relative %: 50.9 %
PLATELETS: 244 10*3/uL (ref 140–400)
RBC: 4.64 10*6/uL (ref 4.20–5.80)
RDW: 13.2 % (ref 11.0–15.0)
Total Lymphocyte: 36 %
WBC: 6.3 10*3/uL (ref 3.8–10.8)
WBCMIX: 674 {cells}/uL (ref 200–950)

## 2017-11-24 ENCOUNTER — Encounter: Payer: Self-pay | Admitting: Emergency Medicine

## 2017-11-24 ENCOUNTER — Other Ambulatory Visit: Payer: Self-pay

## 2017-11-24 ENCOUNTER — Emergency Department
Admission: EM | Admit: 2017-11-24 | Discharge: 2017-11-24 | Disposition: A | Discharge status: Home / Self Care | Payer: BLUE CROSS/BLUE SHIELD | Attending: Emergency Medicine | Admitting: Emergency Medicine

## 2017-11-24 ENCOUNTER — Emergency Department: Payer: BLUE CROSS/BLUE SHIELD

## 2017-11-24 DIAGNOSIS — N23 Unspecified renal colic: Secondary | ICD-10-CM | POA: Insufficient documentation

## 2017-11-24 DIAGNOSIS — Z79899 Other long term (current) drug therapy: Secondary | ICD-10-CM | POA: Insufficient documentation

## 2017-11-24 DIAGNOSIS — I10 Essential (primary) hypertension: Secondary | ICD-10-CM | POA: Insufficient documentation

## 2017-11-24 DIAGNOSIS — R1032 Left lower quadrant pain: Secondary | ICD-10-CM | POA: Diagnosis present

## 2017-11-24 LAB — URINALYSIS, COMPLETE (UACMP) WITH MICROSCOPIC
BILIRUBIN URINE: NEGATIVE
Bacteria, UA: NONE SEEN
GLUCOSE, UA: NEGATIVE mg/dL
KETONES UR: NEGATIVE mg/dL
LEUKOCYTES UA: NEGATIVE
Nitrite: NEGATIVE
PH: 5 (ref 5.0–8.0)
Protein, ur: NEGATIVE mg/dL
SPECIFIC GRAVITY, URINE: 1.021 (ref 1.005–1.030)
Squamous Epithelial / HPF: NONE SEEN (ref 0–5)

## 2017-11-24 LAB — COMPREHENSIVE METABOLIC PANEL
ALT: 24 U/L (ref 0–44)
AST: 24 U/L (ref 15–41)
Albumin: 4.4 g/dL (ref 3.5–5.0)
Alkaline Phosphatase: 74 U/L (ref 38–126)
Anion gap: 8 (ref 5–15)
BILIRUBIN TOTAL: 0.5 mg/dL (ref 0.3–1.2)
BUN: 17 mg/dL (ref 6–20)
CO2: 29 mmol/L (ref 22–32)
CREATININE: 1.29 mg/dL — AB (ref 0.61–1.24)
Calcium: 9 mg/dL (ref 8.9–10.3)
Chloride: 102 mmol/L (ref 98–111)
GFR calc Af Amer: >60 mL/min (ref >60)
GLUCOSE: 159 mg/dL — AB (ref 70–99)
Potassium: 3.8 mmol/L (ref 3.5–5.1)
Sodium: 139 mmol/L (ref 135–145)
TOTAL PROTEIN: 7.9 g/dL (ref 6.5–8.1)

## 2017-11-24 LAB — CBC WITH DIFFERENTIAL/PLATELET
ABS IMMATURE GRANULOCYTES: 0.05 10*3/uL (ref 0.00–0.07)
BASOS PCT: 0 %
Basophils Absolute: 0.1 10*3/uL (ref 0.0–0.1)
Eosinophils Absolute: 0 10*3/uL (ref 0.0–0.5)
Eosinophils Relative: 0 %
HCT: 45 % (ref 39.0–52.0)
HEMOGLOBIN: 15.5 g/dL (ref 13.0–17.0)
IMMATURE GRANULOCYTES: 0 %
LYMPHS PCT: 7 %
Lymphs Abs: 1 10*3/uL (ref 0.7–4.0)
MCH: 30.8 pg (ref 26.0–34.0)
MCHC: 34.4 g/dL (ref 30.0–36.0)
MCV: 89.5 fL (ref 80.0–100.0)
Monocytes Absolute: 0.7 10*3/uL (ref 0.1–1.0)
Monocytes Relative: 5 %
Neutro Abs: 12.7 10*3/uL — ABNORMAL HIGH (ref 1.7–7.7)
Neutrophils Relative %: 88 %
PLATELETS: 241 10*3/uL (ref 150–400)
RBC: 5.03 MIL/uL (ref 4.22–5.81)
RDW: 13.1 % (ref 11.5–15.5)
WBC: 14.4 10*3/uL — AB (ref 4.0–10.5)
nRBC: 0 % (ref 0.0–0.2)

## 2017-11-24 MED ORDER — ONDANSETRON HCL 4 MG/2ML IJ SOLN
4.0000 mg | Freq: Once | INTRAMUSCULAR | Status: AC
  Administered 2017-11-24: 4 mg via INTRAVENOUS
  Filled 2017-11-24: qty 2

## 2017-11-24 MED ORDER — ONDANSETRON HCL 4 MG/2ML IJ SOLN
4.0000 mg | Freq: Once | INTRAMUSCULAR | Status: DC
Start: 2017-11-24 — End: 2017-11-24

## 2017-11-24 MED ORDER — MORPHINE SULFATE (PF) 4 MG/ML IV SOLN
4.0000 mg | Freq: Once | INTRAVENOUS | Status: AC
  Administered 2017-11-24: 4 mg via INTRAVENOUS
  Filled 2017-11-24: qty 1

## 2017-11-24 MED ORDER — SODIUM CHLORIDE 0.9 % IV BOLUS
1000.0000 mL | Freq: Once | INTRAVENOUS | Status: AC
  Administered 2017-11-24: 1000 mL via INTRAVENOUS

## 2017-11-24 MED ORDER — IOPAMIDOL (ISOVUE-300) INJECTION 61%
100.0000 mL | Freq: Once | INTRAVENOUS | Status: AC | PRN
  Administered 2017-11-24: 100 mL via INTRAVENOUS

## 2017-11-24 MED ORDER — KETOROLAC TROMETHAMINE 30 MG/ML IJ SOLN
15.0000 mg | INTRAMUSCULAR | Status: AC
  Administered 2017-11-24: 15 mg via INTRAVENOUS
  Filled 2017-11-24: qty 1

## 2017-11-24 MED ORDER — ONDANSETRON 4 MG PO TBDP: 4.0000 mg | ORAL_TABLET | Freq: Three times a day (TID) | ORAL | 0 refills | Status: DC | PRN

## 2017-11-24 MED ORDER — KETOROLAC TROMETHAMINE 10 MG PO TABS: 10.0000 mg | ORAL_TABLET | Freq: Four times a day (QID) | ORAL | 0 refills | Status: DC | PRN

## 2017-11-24 NOTE — ED Notes (Signed)
Patient to Rm 4, Devan RN aware of room placement.  Patient given gown.

## 2017-11-24 NOTE — ED Triage Notes (Signed)
C/O LLQ abdominal pain.  Onset of symptoms this morning at 0400, awoke from sleep.

## 2017-11-24 NOTE — ED Notes (Signed)
First Nurse Note:  Patient complaining of left sided pain starting at 0430 this AM. States "it feels like I need to pee", states he was able to urinate.

## 2017-11-24 NOTE — ED Provider Notes (Signed)
[facility] Emergency Department Provider Note  ____________________________________________  Time seen: Approximately 12:23 PM  I have reviewed the triage vital signs and the nursing notes.   HISTORY  Chief Complaint Abdominal Pain    HPI Michael Peck is a 52 y.o. male with a history of hypertension and psoriatic arthritis who comes to the ED with sudden onset of left lower quadrant abdominal pain that started at 4:00 AM today, nonradiating, no aggravating or alleviating factors, constant, severe, sharp.  Unable to find a position of comfort.  Never had any symptoms like this before.  No fevers chills or sweats.  No black or bloody stool.      Past Medical History:  Diagnosis Date  . Arthritis   . Hypertension   . Psoriasis 12/09/2015  . Psoriatic arthritis (HCC) 12/09/2015   Failed Enbrel     Patient Active Problem List   Diagnosis Date Noted  . High risk medication use 12/10/2015  . Low back pain 12/10/2015  . Obesity 12/10/2015  . Psoriasis 12/09/2015  . Psoriatic arthritis (HCC) 12/09/2015     Past Surgical History:  Procedure Laterality Date  . ADENOIDECTOMY       Prior to Admission medications   Medication Sig Start Date End Date Taking? Authorizing Provider  amLODipine (NORVASC) 10 MG tablet Take 10 mg by mouth daily. 10/27/17  Yes [provider]  B-D TB SYRINGE 1CC/27GX1/2" 27G X 1/2" 1 ML MISC INJECT 1 SYRINGE INTO THE SKIN ONCE A WEEK. TO BE USED WITH WEEKLY METHOTREXATE INJECTIONS. 11/03/17  Yes Deveshwar, Janalyn Rouse, MD  COSENTYX SENSOREADY 300 DOSE 150 MG/ML SOAJ INJECT TWO PENS SUBCUTANEOUSLY EVERY 4 WEEKS. REFRIGERATE. ALLOW 15 TO30 MINUTES AT ROOM TEMP PRIOR TO ADMINISTRATION. 09/11/17  Yes Deveshwar, Janalyn Rouse, MD  folic acid (FOLVITE) 1 MG tablet Take 2 tablets (2 mg total) by mouth daily. 10/17/17  Yes Gearldine Bienenstock, PA-C  methotrexate 50 MG/2ML injection INJECT 0.8 ML SUBCUTANEOUS WEEKLY 08/24/17  Yes Deveshwar, Janalyn Rouse, MD   venlafaxine XR (EFFEXOR-XR) 75 MG 24 hr capsule Take 150 mg by mouth daily. 02/13/16  Yes [provider]  amLODipine (NORVASC) 5 MG tablet Take 1 tablet (5 mg total) by mouth daily. Patient not taking: Reported on 11/24/2017 04/14/17   Pollyann Savoy, MD  ketorolac (TORADOL) 10 MG tablet Take 1 tablet (10 mg total) by mouth every 6 (six) hours as needed for moderate pain. 11/24/17   Sharman Cheek, MD  ondansetron (ZOFRAN ODT) 4 MG disintegrating tablet Take 1 tablet (4 mg total) by mouth every 8 (eight) hours as needed for nausea or vomiting. 11/24/17   Sharman Cheek, MD     Allergies Patient has no known allergies.   Family History  Problem Relation Age of Onset  . Epilepsy Mother   . Cancer Father        prostate   . Healthy Daughter     Social History Social History   Tobacco Use  . Smoking status: Never Smoker  . Smokeless tobacco: Never Used  Substance Use Topics  . Alcohol use: No  . Drug use: No    Review of Systems  Constitutional:   No fever or chills.  ENT:   No sore throat. No rhinorrhea. Cardiovascular:   No chest pain or syncope. Respiratory:   No dyspnea or cough. Gastrointestinal:   Positive as above for abdominal pain without vomiting and diarrhea.  Musculoskeletal:   Negative for focal pain or swelling All other systems reviewed and are negative  except as documented above in ROS and HPI.  ____________________________________________   PHYSICAL EXAM:  VITAL SIGNS: ED Triage Vitals  Enc Vitals Group     BP 11/24/17 0814 (!) 161/89     Pulse Rate 11/24/17 0814 61     Resp 11/24/17 1122 16     Temp 11/24/17 0815 (!) 97.5 F (36.4 C)     Temp Source 11/24/17 0814 Oral     SpO2 11/24/17 0814 99 %     Weight 11/24/17 0814 212 lb (96.2 kg)     Height 11/24/17 0814 5\' 10"  (1.778 m)     Head Circumference --      Peak Flow --      Pain Score 11/24/17 0815 6     Pain Loc --      Pain Edu? --      Excl. in GC? --     Vital  signs reviewed, nursing assessments reviewed.   Constitutional:   Alert and oriented. Non-toxic appearance. Eyes:   Conjunctivae are normal. EOMI. PERRL. ENT      Head:   Normocephalic and atraumatic.      Nose:   No congestion/rhinnorhea.       Mouth/Throat:   MMM, no pharyngeal erythema. No peritonsillar mass.       Neck:   No meningismus. Full ROM. Hematological/Lymphatic/Immunilogical:   No cervical lymphadenopathy. Cardiovascular:   RRR. Symmetric bilateral radial and DP pulses.  No murmurs. Cap refill less than 2 seconds. Respiratory:   Normal respiratory effort without tachypnea/retractions. Breath sounds are clear and equal bilaterally. No wheezes/rales/rhonchi. Gastrointestinal:   Soft and nontender. Non distended. There is no CVA tenderness.  No rebound, rigidity, or guarding. Musculoskeletal:   Normal range of motion in all extremities. No joint effusions.  No lower extremity tenderness.  No edema. Neurologic:   Normal speech and language.  Motor grossly intact. No acute focal neurologic deficits are appreciated.  Skin:    Skin is warm, dry and intact. No rash noted.  No petechiae, purpura, or bullae.  ____________________________________________    LABS (pertinent positives/negatives) (all labs ordered are listed, but only abnormal results are displayed) Labs Reviewed  COMPREHENSIVE METABOLIC PANEL - Abnormal; Notable for the following components:      Result Value   Glucose, Bld 159 (*)    Creatinine, Ser 1.29 (*)    All other components within normal limits  CBC WITH DIFFERENTIAL/PLATELET - Abnormal; Notable for the following components:   WBC 14.4 (*)    Neutro Abs 12.7 (*)    All other components within normal limits  URINALYSIS, COMPLETE (UACMP) WITH MICROSCOPIC   ____________________________________________   EKG    ____________________________________________    RADIOLOGY  Ct Abdomen Pelvis W Contrast  Result Date: 11/24/2017 CLINICAL DATA:   Left lower quadrant pain starting this morning. EXAM: CT ABDOMEN AND PELVIS WITH CONTRAST TECHNIQUE: Multidetector CT imaging of the abdomen and pelvis was performed using the standard protocol following bolus administration of intravenous contrast. CONTRAST:  ISOVUE-300 IOPAMIDOL (ISOVUE-300) INJECTION 61% COMPARISON:  None. FINDINGS: Lower chest: No acute abnormality. Hepatobiliary: No focal liver abnormality is seen. No gallstones, gallbladder wall thickening, or biliary dilatation. Pancreas: Unremarkable. No pancreatic ductal dilatation or surrounding inflammatory changes. Spleen: Normal in size without focal abnormality. Adrenals/Urinary Tract: Adrenal glands appear normal. 2 mm stone within the distal LEFT ureter, just proximal to the LEFT UVJ, causing mild hydronephrosis and perinephric edema. Additional 3 mm LEFT renal stone. RIGHT kidney is unremarkable without stone  or hydronephrosis. Bladder is decompressed. Stomach/Bowel: No dilated large or small bowel loops. No evidence of bowel wall inflammation. Appendix is normal. Stomach is unremarkable, partially decompressed. Vascular/Lymphatic: No significant vascular findings are present. No enlarged abdominal or pelvic lymph nodes. Reproductive: Prostate is unremarkable. Other: No abscess collection.  No free intraperitoneal air. Musculoskeletal: No acute or suspicious osseous finding. Mild degenerative spondylosis within the lower lumbar spine. IMPRESSION: 1. 2 mm stone within the distal LEFT ureter, located approximately 1 cm proximal to the LEFT UVJ, causing mild LEFT-sided hydronephrosis and perinephric edema. 2. 3 mm LEFT renal stone. Electronically Signed   By: Bary Richard M.D.   On: 11/24/2017 10:20    ____________________________________________   PROCEDURES Procedures  ____________________________________________  DIFFERENTIAL DIAGNOSIS   Diverticulitis, kidney stone, cystitis, pyelonephritis  CLINICAL IMPRESSION / ASSESSMENT  AND PLAN / ED COURSE  Pertinent labs & imaging results that were available during my care of the patient were reviewed by me and considered in my medical decision making (see chart for details).      Clinical Course as of Nov 24 1221  Fri Nov 24, 2017  1610 Renal colic versus diverticulitis.  Patient takes an anti-interleukin monoclonal antibody and methotrexate, making exam unreliable.  Will obtain labs and CT abdomen.  Vital signs normal.   [PS]    Clinical Course User Index [PS] Sharman Cheek, MD     ----------------------------------------- 12:26 PM on 11/24/2017 -----------------------------------------  CT shows small distal ureter stone on the left.  Patient informed of this finding as well as the renal stone that he has on the left as well that may cause similar symptoms in the future.  Discussed with labs will run his urinalysis now to ensure he does not need to be treated for urinary infection.  No evidence of hydronephrosis or other comp location of the stone, normal renal function, suitable for discharge home and outpatient follow-up.  ____________________________________________   FINAL CLINICAL IMPRESSION(S) / ED DIAGNOSES    Final diagnoses:  Ureteral colic     ED Discharge Orders         Ordered    ketorolac (TORADOL) 10 MG tablet  Every 6 hours PRN     11/24/17 1221    ondansetron (ZOFRAN ODT) 4 MG disintegrating tablet  Every 8 hours PRN     11/24/17 1221          Portions of this note were generated with dragon dictation software. Dictation errors may occur despite best attempts at proofreading.    Sharman Cheek, MD 11/24/17 1226

## 2017-11-24 NOTE — Discharge Instructions (Signed)
Your CT scan shows a 2 mm kidney stone on the left that is causing your symptoms.  You do have a

## 2017-12-19 ENCOUNTER — Other Ambulatory Visit: Payer: Self-pay | Admitting: Rheumatology

## 2017-12-20 NOTE — Telephone Encounter (Signed)
Last Visit: 10/17/17 Next Visit: 03/21/18 Labs: 11/24/17 elevated glucose creat 1.29 previously 1.00 WBC 14.4 previously 6.9 TB Gold: 04/13/17 Neg   Okay to refill Cosentyx?

## 2017-12-20 NOTE — Telephone Encounter (Signed)
Please advise patient to have repeat labs CBC and CMP.  It is okay to refill Cosentyx if he is not running any fever and has no signs of infection.

## 2017-12-21 NOTE — Telephone Encounter (Signed)
Patient states he will come in Monday to have repeat labs drawn. Lab orders are placed. Patient states he does not have any signs of infection or fever. Advised patient we could refill cosentyx, patient verbalized understanding.

## 2018-01-08 ENCOUNTER — Other Ambulatory Visit: Payer: Self-pay

## 2018-01-08 DIAGNOSIS — Z79899 Other long term (current) drug therapy: Secondary | ICD-10-CM

## 2018-01-09 LAB — CBC WITH DIFFERENTIAL/PLATELET
Basophils Absolute: 28 cells/uL (ref 0–200)
Basophils Relative: 0.4 %
Eosinophils Absolute: 147 cells/uL (ref 15–500)
Eosinophils Relative: 2.1 %
HCT: 41.4 % (ref 38.5–50.0)
Hemoglobin: 14.4 g/dL (ref 13.2–17.1)
Lymphs Abs: 2310 cells/uL (ref 850–3900)
MCH: 30.6 pg (ref 27.0–33.0)
MCHC: 34.8 g/dL (ref 32.0–36.0)
MCV: 88.1 fL (ref 80.0–100.0)
MPV: 11.4 fL (ref 7.5–12.5)
Monocytes Relative: 9.6 %
Neutro Abs: 3843 cells/uL (ref 1500–7800)
Neutrophils Relative %: 54.9 %
Platelets: 236 10*3/uL (ref 140–400)
RBC: 4.7 10*6/uL (ref 4.20–5.80)
RDW: 13 % (ref 11.0–15.0)
Total Lymphocyte: 33 %
WBC mixed population: 672 cells/uL (ref 200–950)
WBC: 7 10*3/uL (ref 3.8–10.8)

## 2018-01-09 LAB — COMPLETE METABOLIC PANEL WITH GFR
AG Ratio: 1.5 (calc) (ref 1.0–2.5)
ALKALINE PHOSPHATASE (APISO): 66 U/L (ref 40–115)
ALT: 15 U/L (ref 9–46)
AST: 14 U/L (ref 10–35)
Albumin: 4.1 g/dL (ref 3.6–5.1)
BUN: 17 mg/dL (ref 7–25)
CO2: 29 mmol/L (ref 20–32)
Calcium: 9.3 mg/dL (ref 8.6–10.3)
Chloride: 106 mmol/L (ref 98–110)
Creat: 0.91 mg/dL (ref 0.70–1.33)
GFR, Est African American: 113 mL/min/{1.73_m2} (ref >=60)
GFR, Est Non African American: 97 mL/min/{1.73_m2} (ref >=60)
Globulin: 2.7 g/dL (calc) (ref 1.9–3.7)
Glucose, Bld: 99 mg/dL (ref 65–99)
Potassium: 4.3 mmol/L (ref 3.5–5.3)
Sodium: 142 mmol/L (ref 135–146)
Total Bilirubin: 0.4 mg/dL (ref 0.2–1.2)
Total Protein: 6.8 g/dL (ref 6.1–8.1)

## 2018-01-09 NOTE — Progress Notes (Signed)
Within normal limits

## 2018-01-19 ENCOUNTER — Other Ambulatory Visit: Payer: Self-pay | Admitting: Rheumatology

## 2018-01-19 NOTE — Telephone Encounter (Signed)
Last Visit: 10/17/17 Next Visit: 03/21/18 Labs: 11/24/17 elevated glucose creat 1.29 previously 1.00 WBC 14.4 previously 6.9 TB Gold: 04/13/17 Neg   Okay to refill per Dr. Corliss Skainseveshwar

## 2018-02-08 ENCOUNTER — Other Ambulatory Visit: Payer: Self-pay | Admitting: Rheumatology

## 2018-02-08 NOTE — Telephone Encounter (Signed)
Last Visit: 10/17/17 Next Visit: 03/21/18 Labs: 01/08/18 WNL TB Gold: 04/13/17 Neg   Okay to refill per Dr. Corliss Skainseveshwar

## 2018-03-07 NOTE — Progress Notes (Signed)
Office Visit Note  Patient: Michael LamasMark Bergquist             Date of Birth: [birth year]           MRN: [id number]             PCP: Jerl MinaHedrick, James, MD Referring: Jerl MinaHedrick, James, MD Visit Date: 03/21/2018 Occupation: @GUAROCC @  Subjective:  Medication monitoring   History of Present Illness: Michael Peck is a 53 y.o. male with history of psoriatic arthritis.  He is on Cosentyx 300 mg sq injections once monthly, MTX 0.8 ml sq once weekly, and folic acid 2 mg po daily.   He has not missed any doses recently.  He denies any side effects or injection site reactions.  He denies any recent infections and he reports he received the annual influenza vaccination.  He denies any joint pain or joint swelling at this time.  He has morning stiffness lasting about 5 minutes.  He denies any psoriasis.  He denies any SI joint pain.  He denies any plantar fasciitis or achilles tendonitis. He reports his level of fatigue has been stable.    Activities of Daily Living:  Patient reports morning stiffness for 5 minutes.   Patient Denies nocturnal pain.  Difficulty dressing/grooming: Denies Difficulty climbing stairs: Denies Difficulty getting out of chair: Denies Difficulty using hands for taps, buttons, cutlery, and/or writing: Denies  Review of Systems  Constitutional: Positive for fatigue. Negative for night sweats.  HENT: Negative for mouth sores, mouth dryness and nose dryness.   Eyes: Positive for dryness. Negative for photophobia, redness and visual disturbance.  Respiratory: Negative for cough, hemoptysis, shortness of breath and difficulty breathing.   Cardiovascular: Negative for chest pain, palpitations, hypertension, irregular heartbeat and swelling in legs/feet.  Gastrointestinal: Negative for blood in stool, constipation and diarrhea.  Endocrine: Negative for increased urination.  Genitourinary: Negative for painful urination.  Musculoskeletal: Positive for morning stiffness. Negative for arthralgias,  joint pain, joint swelling, myalgias, muscle weakness, muscle tenderness and myalgias.  Skin: Negative for color change, rash, hair loss, nodules/bumps, skin tightness, ulcers and sensitivity to sunlight.  Allergic/Immunologic: Negative for susceptible to infections.  Neurological: Negative for dizziness, fainting, memory loss, night sweats and weakness.  Hematological: Negative for swollen glands.  Psychiatric/Behavioral: Negative for depressed mood and sleep disturbance. The patient is not nervous/anxious.     PMFS History:  Patient Active Problem List   Diagnosis Date Noted  . High risk medication use 12/10/2015  . Low back pain 12/10/2015  . Obesity 12/10/2015  . Psoriasis 12/09/2015  . Psoriatic arthritis (HCC) 12/09/2015    Past Medical History:  Diagnosis Date  . Arthritis   . Hypertension   . Psoriasis 12/09/2015  . Psoriatic arthritis (HCC) 12/09/2015   Failed Enbrel    Family History  Problem Relation Age of Onset  . Epilepsy Mother   . Cancer Father        prostate   . Healthy Daughter    Past Surgical History:  Procedure Laterality Date  . ADENOIDECTOMY     Social History   Social History Narrative  . Not on file    There is no immunization history on file for this patient.   Objective: Vital Signs: BP 140/87 (BP Location: Left Arm, Patient Position: Sitting, Cuff Size: Small)   Pulse 60   Resp 12   Ht 5\' 10"  (1.778 m)   Wt 229 lb 9.6 oz (104.1 kg)   BMI 32.94 kg/m    Physical  Exam Vitals signs and nursing note reviewed.  Constitutional:      Appearance: He is well-developed.  HENT:     Head: Normocephalic and atraumatic.  Eyes:     Conjunctiva/sclera: Conjunctivae normal.     Pupils: Pupils are equal, round, and reactive to light.  Neck:     Musculoskeletal: Normal range of motion and neck supple.  Cardiovascular:     Rate and Rhythm: Normal rate and regular rhythm.     Heart sounds: Normal heart sounds.  Pulmonary:     Effort: Pulmonary  effort is normal.     Breath sounds: Normal breath sounds.  Abdominal:     General: Bowel sounds are normal.     Palpations: Abdomen is soft.  Lymphadenopathy:     Cervical: No cervical adenopathy.  Skin:    General: Skin is warm and dry.     Capillary Refill: Capillary refill takes less than 2 seconds.     Comments: Fingernail pitting noted   Neurological:     Mental Status: He is alert and oriented to person, place, and time.  Psychiatric:        Behavior: Behavior normal.      Musculoskeletal Exam: C-spine, thoracic spine, and lumbar spine good ROM.  No midline spinal tenderness.  No SI joint tenderness.  Shoulder joints, elbow joints, wrist joints, MCPs, PIPs, and DIPs good ROM with no synovitis.  PIP and DIP synovial thickening.  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 Hip joints, knee joints, ankle joints, MTPs, PIPs, and DIPs good ROM with no synovitis.  No warmth or effusion of knee joints. Bilateral knee joint crepitus. No tenderness or swelling of ankle joints.  No achilles tendonitis or plantar fasciitis.  No tenderness over trochanteric bursa bilaterally.    CDAI Exam: CDAI Score: 0.6  Patient Global Assessment: 3 (mm); Provider Global Assessment: 3 (mm) Swollen: 0 ; Tender: 0  Joint Exam   Not documented   There is currently no information documented on the homunculus. Go to the Rheumatology activity and complete the homunculus joint exam.  Investigation: No additional findings.  Imaging: No results found.  Recent Labs: Lab Results  Component Value Date   WBC 7.0 01/08/2018   HGB 14.4 01/08/2018   PLT 236 01/08/2018   NA 142 01/08/2018   K 4.3 01/08/2018   CL 106 01/08/2018   CO2 29 01/08/2018   GLUCOSE 99 01/08/2018   BUN 17 01/08/2018   CREATININE 0.91 01/08/2018   BILITOT 0.4 01/08/2018   ALKPHOS 74 11/24/2017   AST 14 01/08/2018   ALT 15 01/08/2018   PROT 6.8 01/08/2018   ALBUMIN 4.4 11/24/2017   CALCIUM 9.3 01/08/2018   GFRAA 113 01/08/2018    QFTBGOLDPLUS NEGATIVE 04/13/2017    Speciality Comments: No specialty comments available.  Procedures:  No procedures performed Allergies: Patient has no known allergies.    Assessment / Plan:     Visit Diagnoses: Psoriatic arthritis (HCC) - Erosive disease with dactylitis and sacroiliitis in the past: He has no active synovitis or dactylitis on exam. 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 He has no SI joint tenderness.  No achilles tendonitis or plantar fasciitis. He has no psoriasis but fingernail pitting was noted. He has not joint pain or joint swelling at this time.  He has not had any recent flares.  He is clinically doing well on Cosentyx 300 mg sq injections once monthly, MTX 0.8 ml sq once weekly, and folic acid 2 mg po daily.  He has not missed any doses recently.  He is tolerating both medications without any side effects.  He does not need any refills at this time.  He will continue on this current treatment regimen.  He was advised to notify us if he develops increased joint pain or joint swelling.  He will follow up in 5 months.    Psoriasis: He has no psoriasis at this time.  He has fingernail pitting.    Sacroiliitis [facility](HCC): He has no SI joint tenderness.    High risk medication use - Cosentyx 300 mg sq q month, Methotrexate 0.8 ml sq q wk, Folic acid 2 mg po qd. (FAILED ENBREL & Humira). CBC and CMP were drawn on 01/09/18. He will return in March and every 3 months.  Future order for TB gold was placed day which is due.  He has not had any recent infections.  He received the annual influenza vaccine.  - Plan: QuantiFERON-TB Gold Plus  Other medical conditions are listed as follows:   History of hypertension  History of obesity   Orders: Orders Placed This Encounter  Procedures  . QuantiFERON-TB Gold Plus   No orders of the defined types were placed in this encounter.    Follow-Up Instructions: Return in about 5 months (around 08/19/2018) for  Psoriatic arthritis.   Gearldine Bienenstockaylor M Erin Uecker, PA-C  Note - This record has been created using Dragon software.  Chart creation errors have been sought, but may not always  have been located. Such creation errors do not reflect on  the standard of medical care.

## 2018-03-21 ENCOUNTER — Ambulatory Visit (INDEPENDENT_AMBULATORY_CARE_PROVIDER_SITE_OTHER): Payer: BLUE CROSS/BLUE SHIELD | Admitting: Physician Assistant

## 2018-03-21 ENCOUNTER — Encounter: Payer: Self-pay | Admitting: Physician Assistant

## 2018-03-21 VITALS — BP 140/87 | HR 60 | Resp 12 | Ht 70.0 in | Wt 229.6 lb

## 2018-03-21 DIAGNOSIS — M461 Sacroiliitis, not elsewhere classified: Secondary | ICD-10-CM | POA: Diagnosis not present

## 2018-03-21 DIAGNOSIS — L4050 Arthropathic psoriasis, unspecified: Secondary | ICD-10-CM | POA: Diagnosis not present

## 2018-03-21 DIAGNOSIS — L409 Psoriasis, unspecified: Secondary | ICD-10-CM

## 2018-03-21 DIAGNOSIS — Z79899 Other long term (current) drug therapy: Secondary | ICD-10-CM

## 2018-03-21 DIAGNOSIS — Z8679 Personal history of other diseases of the circulatory system: Secondary | ICD-10-CM

## 2018-03-21 DIAGNOSIS — Z8639 Personal history of other endocrine, nutritional and metabolic disease: Secondary | ICD-10-CM

## 2018-03-21 NOTE — Patient Instructions (Signed)
Standing Labs We placed an order today for your standing lab work.    Please come back and get your standing labs in March and every 3 months  We have open lab Monday through Friday from 8:30-11:30 AM and 1:30-4:00 PM  at the office of Dr. Shaili Deveshwar.   You may experience shorter wait times on Monday and Friday afternoons. The office is located at [address], [address], [place], [place] [address] No appointment is necessary.   Labs are drawn by [facility].  You may receive a bill from [facility] for your lab work.  If you wish to have your labs drawn at another location, please call the office 24 hours in advance to send orders.  If you have any questions regarding directions or hours of operation,  please call [phone].   Just as a reminder please drink plenty of water prior to coming for your lab work. Thanks!  

## 2018-04-11 ENCOUNTER — Telehealth: Payer: Self-pay | Admitting: Rheumatology

## 2018-04-11 NOTE — Telephone Encounter (Signed)
Patient calling to see if there is any drug reactions with using Androgel, Cosentyx and  MTX. Please call to advise.

## 2018-04-11 NOTE — Telephone Encounter (Signed)
Patient advised that per [employer] pharmacist in office there is no contraindication with Androgel, Cosentyx and MTX. Patient verbalized understanding.

## 2018-05-12 ENCOUNTER — Other Ambulatory Visit: Payer: Self-pay | Admitting: Rheumatology

## 2018-05-12 DIAGNOSIS — Z79899 Other long term (current) drug therapy: Secondary | ICD-10-CM

## 2018-05-14 NOTE — Telephone Encounter (Addendum)
Last Visit: 03/21/18 Next Visit: 08/23/18 Labs: 01/08/18 WNL  Patient advised he is due to update labs. Patient will update this week.   Okay to refill per Dr. Corliss Skains

## 2018-05-17 LAB — CBC WITH DIFFERENTIAL/PLATELET
Absolute Monocytes: 850 cells/uL (ref 200–950)
Basophils Absolute: 18 cells/uL (ref 0–200)
Basophils Relative: 0.3 %
Eosinophils Absolute: 53 cells/uL (ref 15–500)
Eosinophils Relative: 0.9 %
HCT: 42.7 % (ref 38.5–50.0)
Hemoglobin: 14.8 g/dL (ref 13.2–17.1)
Lymphs Abs: 1835 cells/uL (ref 850–3900)
MCH: 30.7 pg (ref 27.0–33.0)
MCHC: 34.7 g/dL (ref 32.0–36.0)
MCV: 88.6 fL (ref 80.0–100.0)
MPV: 11.8 fL (ref 7.5–12.5)
Monocytes Relative: 14.4 %
Neutro Abs: 3145 cells/uL (ref 1500–7800)
Neutrophils Relative %: 53.3 %
Platelets: 197 10*3/uL (ref 140–400)
RBC: 4.82 10*6/uL (ref 4.20–5.80)
RDW: 13.5 % (ref 11.0–15.0)
Total Lymphocyte: 31.1 %
WBC: 5.9 10*3/uL (ref 3.8–10.8)

## 2018-05-17 LAB — COMPLETE METABOLIC PANEL WITH GFR
AG Ratio: 1.4 (calc) (ref 1.0–2.5)
ALT: 25 U/L (ref 9–46)
AST: 19 U/L (ref 10–35)
Albumin: 4 g/dL (ref 3.6–5.1)
Alkaline phosphatase (APISO): 64 U/L (ref 35–144)
BUN: 10 mg/dL (ref 7–25)
CO2: 27 mmol/L (ref 20–32)
Calcium: 8.9 mg/dL (ref 8.6–10.3)
Chloride: 104 mmol/L (ref 98–110)
Creat: 1.16 mg/dL (ref 0.70–1.33)
GFR, Est African American: 83 mL/min/{1.73_m2} (ref >=60)
GFR, Est Non African American: 72 mL/min/{1.73_m2} (ref >=60)
Globulin: 2.8 g/dL (calc) (ref 1.9–3.7)
Glucose, Bld: 106 mg/dL — ABNORMAL HIGH (ref 65–99)
Potassium: 4.7 mmol/L (ref 3.5–5.3)
Sodium: 140 mmol/L (ref 135–146)
Total Bilirubin: 0.5 mg/dL (ref 0.2–1.2)
Total Protein: 6.8 g/dL (ref 6.1–8.1)

## 2018-08-06 ENCOUNTER — Other Ambulatory Visit: Payer: Self-pay | Admitting: Rheumatology

## 2018-08-06 NOTE — Telephone Encounter (Signed)
Last Visit: 03/21/18 Next Visit: 08/23/18 Labs: 05/16/18 Glucose is 106. Rest of lab work is WNL.  Okay to refill per Dr. Estanislado Pandy

## 2018-08-09 NOTE — Progress Notes (Deleted)
Office Visit Note  Patient: Michael Peck             Date of Birth: [birth year]           MRN: [id number]             PCP: Jerl MinaHedrick, James, MD Referring: Jerl MinaHedrick, James, MD Visit Date: 08/23/2018 Occupation: @GUAROCC @  Subjective:  No chief complaint on file.  Cosentyx 300 mg every 28 days, methotrexate 0.8 mL every 7 days, and folic acid 1 mg 2 tablets daily.  Last TB gold negative on 04/13/2017.  Due for TB gold today and will monitor yearly.  Most recent CBC/CMP within normal limits on 05/16/2018.  Due for CBC/CMP today and will monitor every 3 months.  Standing orders placed.  He received the flu vaccine in September. Recommend annual influenza, Pneumovax 23, Prevnar 13, and Shingrix as indicated for immunosuppressant therapy.    History of Present Illness: Michael LamasMark Peck is a 53 y.o. male ***   Activities of Daily Living:  Patient reports morning stiffness for *** {minute/hour:19697}.   Patient {ACTIONS;DENIES/REPORTS:21021675::"Denies"} nocturnal pain.  Difficulty dressing/grooming: {ACTIONS;DENIES/REPORTS:21021675::"Denies"} Difficulty climbing stairs: {ACTIONS;DENIES/REPORTS:21021675::"Denies"} Difficulty getting out of chair: {ACTIONS;DENIES/REPORTS:21021675::"Denies"} Difficulty using hands for taps, buttons, cutlery, and/or writing: {ACTIONS;DENIES/REPORTS:21021675::"Denies"}  No Rheumatology ROS completed.   PMFS History:  Patient Active Problem List   Diagnosis Date Noted  . High risk medication use 12/10/2015  . Low back pain 12/10/2015  . Obesity 12/10/2015  . Psoriasis 12/09/2015  . Psoriatic arthritis (HCC) 12/09/2015    Past Medical History:  Diagnosis Date  . Arthritis   . Hypertension   . Psoriasis 12/09/2015  . Psoriatic arthritis (HCC) 12/09/2015   Failed Enbrel    Family History  Problem Relation Age of Onset  . Epilepsy Mother   . Cancer Father        prostate   . Healthy Daughter    Past Surgical History:  Procedure Laterality Date  . ADENOIDECTOMY      Social History   Social History Narrative  . Not on file    There is no immunization history on file for this patient.   Objective: Vital Signs: There were no vitals taken for this visit.   Physical Exam   Musculoskeletal Exam: ***  CDAI Exam: CDAI Score: - Patient Global: -; Provider Global: - Swollen: -; Tender: - Joint Exam   No joint exam has been documented for this visit   There is currently no information documented on the homunculus. Go to the Rheumatology activity and complete the homunculus joint exam.  Investigation: No additional findings.  Imaging: No results found.  Recent Labs: Lab Results  Component Value Date   WBC 5.9 05/16/2018   HGB 14.8 05/16/2018   PLT 197 05/16/2018   NA 140 05/16/2018   K 4.7 05/16/2018   CL 104 05/16/2018   CO2 27 05/16/2018   GLUCOSE 106 (H) 05/16/2018   BUN 10 05/16/2018   CREATININE 1.16 05/16/2018   BILITOT 0.5 05/16/2018   ALKPHOS 74 11/24/2017   AST 19 05/16/2018   ALT 25 05/16/2018   PROT 6.8 05/16/2018   ALBUMIN 4.4 11/24/2017   CALCIUM 8.9 05/16/2018   GFRAA 83 05/16/2018   QFTBGOLDPLUS NEGATIVE 04/13/2017    Speciality Comments: No specialty comments available.  Procedures:  No procedures performed Allergies: Patient has no known allergies.   Assessment / Plan:     Visit Diagnoses: No diagnosis found.   Other medical conditions are listed as follows:   History  of hypertension  History of obesity   Orders: No orders of the defined types were placed in this encounter.  No orders of the defined types were placed in this encounter.   Face-to-face time spent with patient was *** minutes. Greater than 50% of time was spent in counseling and coordination of care.  Follow-Up Instructions: No follow-ups on file.   Earnestine Mealing, CMA  Note - This record has been created using Editor, commissioning.  Chart creation errors have been sought, but may not always  have been located. Such  creation errors do not reflect on  the standard of medical care.

## 2018-08-23 ENCOUNTER — Ambulatory Visit: Payer: Self-pay | Admitting: Physician Assistant

## 2018-08-31 ENCOUNTER — Other Ambulatory Visit: Payer: Self-pay | Admitting: Rheumatology

## 2018-09-03 NOTE — Telephone Encounter (Addendum)
Last Visit: 03/21/18 Next Visit: due July 2020.  Labs: 05/16/18 Glucose is 106. Rest of lab work is WNL.  Left message to advise patient he is due to update labs and due for a follow up visit.   Okay to refill 30 day Supply MTX?

## 2018-09-03 NOTE — Telephone Encounter (Signed)
Ok to send in 30-day supply of MTX

## 2018-09-17 ENCOUNTER — Other Ambulatory Visit: Payer: Self-pay | Admitting: Rheumatology

## 2018-11-04 ENCOUNTER — Other Ambulatory Visit: Payer: Self-pay | Admitting: Physician Assistant

## 2018-11-05 NOTE — Telephone Encounter (Signed)
Last Visit: 03/21/18 Next Visit: due July 2020. Message sent to the front to schedule patient   Okay to refill per Dr. Estanislado Pandy

## 2018-11-05 NOTE — Telephone Encounter (Signed)
Please schedule patient for a follow up visit. Patient was due July 2020. Thanks!  

## 2018-11-06 NOTE — Telephone Encounter (Signed)
LMOM for patient to call and schedule follow-up appointment.   °

## 2019-01-01 ENCOUNTER — Telehealth (INDEPENDENT_AMBULATORY_CARE_PROVIDER_SITE_OTHER): Payer: BC Managed Care – PPO | Admitting: Rheumatology

## 2019-01-01 ENCOUNTER — Other Ambulatory Visit: Payer: Self-pay

## 2019-01-01 ENCOUNTER — Encounter: Payer: Self-pay | Admitting: Rheumatology

## 2019-01-01 DIAGNOSIS — M461 Sacroiliitis, not elsewhere classified: Secondary | ICD-10-CM

## 2019-01-01 DIAGNOSIS — L4050 Arthropathic psoriasis, unspecified: Secondary | ICD-10-CM | POA: Diagnosis not present

## 2019-01-01 DIAGNOSIS — Z8639 Personal history of other endocrine, nutritional and metabolic disease: Secondary | ICD-10-CM

## 2019-01-01 DIAGNOSIS — Z79899 Other long term (current) drug therapy: Secondary | ICD-10-CM | POA: Diagnosis not present

## 2019-01-01 DIAGNOSIS — L409 Psoriasis, unspecified: Secondary | ICD-10-CM

## 2019-01-01 DIAGNOSIS — Z8679 Personal history of other diseases of the circulatory system: Secondary | ICD-10-CM

## 2019-01-01 NOTE — Progress Notes (Signed)
Virtual Visit via Telephone Note  I connected with Michael Peck on 01/01/19 at  1:30 PM EST by telephone and verified that I am speaking with the correct person using two identifiers.  Location: Patient: Home  Provider: Clinic  This service was conducted via virtual visit.  The patient was located at home. I was located in my office.  Consent was obtained prior to the virtual visit and is aware of possible charges through their insurance for this visit.  The patient is an established patient.  Dr. Corliss Skains, MD conducted the virtual visit and Sherron Ales, PA-C acted as scribe during the service.  Office staff helped with scheduling follow up visits after the service was conducted.   I discussed the limitations, risks, security and privacy concerns of performing an evaluation and management service by telephone and the availability of in person appointments. I also discussed with the patient that there may be a patient responsible charge related to this service. The patient expressed understanding and agreed to proceed.  CC: Discuss medications  History of Present Illness: My Michael Peck is a 53 y.o. male with history of psoriatic arthritis.  He has been off of Cosentyx 300 mg sq injections since around April 2020.  He has been taking MTX 0.8 ml sq once weekly and folic acid 2 mg po daily intermittently.  He denies any increased joint pain or joint swelling recently. He denies any achilles tendonitis or plantar fasciitis  He has been walking 20 minutes 3 times daily.  He has been losing 1 lb of weight per week intentionally. He has noticed new plaques of psoriasis recently. He states his psoriasis is extensive currently.  Review of Systems  Constitutional: Positive for malaise/fatigue. Negative for fever.  Eyes: Negative for photophobia, pain, discharge and redness.       +dry eyes  Respiratory: Negative for cough, shortness of breath and wheezing.   Cardiovascular: Negative for chest pain and  palpitations.  Gastrointestinal: Negative for blood in stool, constipation and diarrhea.  Genitourinary: Negative for dysuria.  Musculoskeletal: Negative for back pain, myalgias and neck pain.       +Morning stiffness   Skin: Positive for rash (Psoriasis).  Neurological: Negative for dizziness and headaches.  Psychiatric/Behavioral: Negative for depression. The patient is not nervous/anxious and does not have insomnia.     Observations/Objective: Physical Exam  Constitutional: He is oriented to person, place, and time.  Neurological: He is alert and oriented to person, place, and time.  Psychiatric: Mood, memory, affect and judgment normal.   Patient reports morning stiffness for  10 minutes.   Patient denies nocturnal pain.  Difficulty dressing/grooming: Denies Difficulty climbing stairs: Denies Difficulty getting out of chair: Denies Difficulty using hands for taps, buttons, cutlery, and/or writing: Denies   Assessment and Plan: Visit Diagnoses: Psoriatic arthritis (HCC) - Erosive disease with dactylitis and sacroiliitis in the past: He has no joint pain or joint swelling at this time.  He has no achilles tendonitis or plantar fasciitis.  He has no SI joint pain at this time.  He has been holding Cosentyx since April 2020.  He has been injecting MTX 0.8 ml sq once weekly and taking folic acid 2 mg po daily.  He feels that after his weekly injection of MTX he feels increased fatigue and depression.  He is planning on discussing this further with his PCP.  Once he restarts on Cosentyx and is clinically doing better we may consider discontinuing MTX and continuing on Cosentyx monotherapy.  He is currently having a flare of psoriasis.  He has extensive psoriasis on bilateral lower extremities, scalp, and bilateral elbows.  He will require updated lab work prior to restarting on Cosentyx.  He will need to return to the office for administration of the cosentyx since it has been >3 months.  He  will continue on MTX as prescribed.  He will follow up in 1 month.   Psoriasis: He has noticed new plaques of psoriasis since discontinuing Cosentyx. He has extensive psoriasis on his lower extremities, scalp, and both elbows.    Sacroiliitis [facility]): He has no SI joint pain at this time.   High risk medication use - Cosentyx 300 mg sq q month, Methotrexate 0.8 ml sq q wk, Folic acid 2 mg po qd. (FAILED ENBREL & Humira). He is due to update CBC, CMP, and TB gold.  He plans on obtaining lab work tomorrow.   Other medical conditions are listed as follows:   History of hypertension  History of obesity   Follow Up Instructions: He will follow up in 1 month.    I discussed the assessment and treatment plan with the patient. The patient was provided an opportunity to ask questions and all were answered. The patient agreed with the plan and demonstrated an understanding of the instructions.   The patient was advised to call back or seek an in-person evaluation if the symptoms worsen or if the condition fails to improve as anticipated.  I provided 25 minutes of non-face-to-face time during this encounter.  Bo Merino, MD   Scribed by-  Hazel Sams, PA-C

## 2019-01-07 ENCOUNTER — Telehealth: Payer: Self-pay | Admitting: Rheumatology

## 2019-01-07 DIAGNOSIS — Z79899 Other long term (current) drug therapy: Secondary | ICD-10-CM

## 2019-01-07 NOTE — Telephone Encounter (Signed)
Patient left a voicemail stating he went to [facility] to have his labwork, but was told they haven't received any information.   Please advise

## 2019-01-08 NOTE — Telephone Encounter (Signed)
Lab orders have been released again for [facility]. Patient is aware and will get labs drawn today.

## 2019-01-11 LAB — CMP14+EGFR
ALT: 21 IU/L (ref 0–44)
AST: 19 IU/L (ref 0–40)
Albumin/Globulin Ratio: 1.7 (ref 1.2–2.2)
Albumin: 4.3 g/dL (ref 3.8–4.9)
Alkaline Phosphatase: 91 IU/L (ref 39–117)
BUN/Creatinine Ratio: 15 (ref 9–20)
BUN: 13 mg/dL (ref 6–24)
Bilirubin Total: 0.2 mg/dL (ref 0.0–1.2)
CO2: 25 mmol/L (ref 20–29)
Calcium: 8.9 mg/dL (ref 8.7–10.2)
Chloride: 107 mmol/L — ABNORMAL HIGH (ref 96–106)
Creatinine, Ser: 0.88 mg/dL (ref 0.76–1.27)
GFR calc Af Amer: 114 mL/min/{1.73_m2} (ref >59)
GFR calc non Af Amer: 99 mL/min/{1.73_m2} (ref >59)
Globulin, Total: 2.5 g/dL (ref 1.5–4.5)
Glucose: 103 mg/dL — ABNORMAL HIGH (ref 65–99)
Potassium: 4.7 mmol/L (ref 3.5–5.2)
Sodium: 144 mmol/L (ref 134–144)
Total Protein: 6.8 g/dL (ref 6.0–8.5)

## 2019-01-11 LAB — QUANTIFERON-TB GOLD PLUS
QuantiFERON Mitogen Value: 5.99 IU/mL
QuantiFERON Nil Value: 0.01 IU/mL
QuantiFERON TB1 Ag Value: 0.03 IU/mL
QuantiFERON TB2 Ag Value: 0.02 IU/mL
QuantiFERON-TB Gold Plus: NEGATIVE

## 2019-01-11 LAB — CBC WITH DIFFERENTIAL/PLATELET
Basophils Absolute: 0.1 10*3/uL (ref 0.0–0.2)
Basos: 1 %
EOS (ABSOLUTE): 0.2 10*3/uL (ref 0.0–0.4)
Eos: 2 %
Hematocrit: 42.1 % (ref 37.5–51.0)
Hemoglobin: 14.5 g/dL (ref 13.0–17.7)
Immature Grans (Abs): 0 10*3/uL (ref 0.0–0.1)
Immature Granulocytes: 0 %
Lymphocytes Absolute: 2.2 10*3/uL (ref 0.7–3.1)
Lymphs: 25 %
MCH: 31 pg (ref 26.6–33.0)
MCHC: 34.4 g/dL (ref 31.5–35.7)
MCV: 90 fL (ref 79–97)
Monocytes Absolute: 0.9 10*3/uL (ref 0.1–0.9)
Monocytes: 9 %
Neutrophils Absolute: 5.8 10*3/uL (ref 1.4–7.0)
Neutrophils: 63 %
Platelets: 235 10*3/uL (ref 150–450)
RBC: 4.68 x10E6/uL (ref 4.14–5.80)
RDW: 13.4 % (ref 11.6–15.4)
WBC: 9.1 10*3/uL (ref 3.4–10.8)

## 2019-01-11 NOTE — Telephone Encounter (Signed)
CBC, CMP and TB gold are within normal limits.

## 2019-01-16 ENCOUNTER — Other Ambulatory Visit: Payer: Self-pay

## 2019-01-16 ENCOUNTER — Ambulatory Visit (INDEPENDENT_AMBULATORY_CARE_PROVIDER_SITE_OTHER): Payer: BC Managed Care – PPO

## 2019-01-16 VITALS — BP 131/86 | HR 76

## 2019-01-16 DIAGNOSIS — L4050 Arthropathic psoriasis, unspecified: Secondary | ICD-10-CM | POA: Diagnosis not present

## 2019-01-16 DIAGNOSIS — L409 Psoriasis, unspecified: Secondary | ICD-10-CM | POA: Diagnosis not present

## 2019-01-16 MED ORDER — COSENTYX SENSOREADY (300 MG) 150 MG/ML ~~LOC~~ SOAJ: 300.0000 mg | SUBCUTANEOUS | 0 refills | Status: DC

## 2019-01-16 MED ORDER — SECUKINUMAB (300 MG DOSE) 150 MG/ML ~~LOC~~ SOAJ
300.0000 mg | Freq: Once | SUBCUTANEOUS | Status: AC
  Administered 2019-01-16: 300 mg via SUBCUTANEOUS

## 2019-01-16 NOTE — Progress Notes (Signed)
Patient presents in the office today for re-starting cosentyx. Patient denies fever, signs/symptoms of infection or antibiotic use. Patient has been off of cosentyx since April 2020. Per Dr. Estanislado Pandy, patient will take loading dose of 300mg  weekly for weeks 0,1,2,3,4 and then a maintenance dose of 300mg  every 28 days.   Patient injected himself in left and right lower abdomen. Patient tolerated the injections well. Patient monitored in the office for 30 minutes. No adverse reactions noted. Provided patient with co-pay card, lab and medication schedule.  Administrations This Visit    Secukinumab (300 MG Dose) SOAJ 300 mg    Admin Date 01/16/2019 Action Given Dose 300 mg Route Subcutaneous Administered By Earnestine Mealing, CMA           Medication Samples have been provided to the patient.  Drug name: cosentyx    Strength: 300mg        Qty: 1 box LOT: [id number]  Exp.Date: 03/09/2020  Dosing instructions: Inject 300mg  into the skin once weekly for weeks 0,1,2,3,4.   The patient has been instructed regarding the correct time, dose, and frequency of taking this medication, including desired effects and most common side effects.   Earnestine Mealing 10:34 AM 01/16/2019

## 2019-01-16 NOTE — Patient Instructions (Addendum)
You received week "0" in the office today.  You will inject 300mg  into the skin on 01/23/19, 01/30/19, 02/06/19 and 02/13/19. Your next dose will be 03/13/2019 for the maintenance dose of 300mg  every 28 days.   Standing Labs We placed an order today for your standing lab work.    Please come back and get your standing labs in 1 month and then every 3 months.   We have open lab daily Monday through Thursday from 8:30-12:30 PM and 1:30-4:30 PM and Friday from 8:30-12:30 PM and 1:30-4:00 PM at the office of Dr. Bo Merino.   You may experience shorter wait times on Monday and Friday afternoons. The office is located at [address], [place], [place], [place] [address] No appointment is necessary.   Labs are drawn by [facility].  You may receive a bill from [facility] for your lab work.  If you wish to have your labs drawn at another location, please call the office 24 hours in advance to send orders.  If you have any questions regarding directions or hours of operation,  please call [phone].   Just as a reminder please drink plenty of water prior to coming for your lab work. Thanks!

## 2019-01-22 ENCOUNTER — Telehealth: Payer: Self-pay | Admitting: Rheumatology

## 2019-01-22 NOTE — Telephone Encounter (Signed)
Submitted a Non-formulary Prior Authorization request to [facility] for [place] via Cover My Meds. Will keep an eye out for additional clinical questions. Will update once we receive a response.

## 2019-01-22 NOTE — Telephone Encounter (Signed)
Patient called stating [facility] won't [facility] his prescription of Cosentyx until they receive "authorization" from Dr. Estanislado Pandy.  Patient states he is due to take his next injection on Thursday, 01/24/19.  Please advise.

## 2019-01-22 NOTE — Telephone Encounter (Signed)
Spoke with [facility] and PA is needed for cosentyx. Please advise.  Patient has medication for this week as he was given a sample at new start visit. Advised patient medication needs a prior authorization, he verbalized understanding.

## 2019-01-23 NOTE — Telephone Encounter (Signed)
Called [facility], claim for loading doses processed. They will mail out today to deliver to patient's home tomorrow. 12/17. Called patient and advised.   11:05 AM Beatriz Chancellor, CPhT

## 2019-01-23 NOTE — Telephone Encounter (Signed)
Received message from insurance that Prior Authorization for Cosentyx is already on file through 09/2019.   Called [facility], and rep Lettie placed override for patient to restart with the loading doses.  PA# [id number]- Exp 02/27/19  Phone# [phone]  Called [facility], Rep Ander Purpura advised claim is not processing yet, was advised it sometimes takes an hour to update in the system. She also stated if claim goes through by early this afternoon they can ship medication out today. Will call back to follow up.  Phone# [phone]  9:35 AM Beatriz Chancellor, CPhT

## 2019-02-12 ENCOUNTER — Other Ambulatory Visit: Payer: Self-pay | Admitting: *Deleted

## 2019-02-12 ENCOUNTER — Telehealth: Payer: Self-pay | Admitting: Rheumatology

## 2019-02-12 DIAGNOSIS — Z79899 Other long term (current) drug therapy: Secondary | ICD-10-CM

## 2019-02-12 NOTE — Progress Notes (Deleted)
Virtual Visit via Video Note  I connected with Michael Peck on 02/12/19 at 10:00 AM EST by a video enabled telemedicine application and verified that I am speaking with the correct person using two identifiers.  Location: Patient: Home  Provider: Clinic  This service was conducted via virtual visit.  Both audio and visual tools were used.  The patient was located at home. I was located in my office.  Consent was obtained prior to the virtual visit and is aware of possible charges through their insurance for this visit.  The patient is an established patient.  Dr. Corliss Skains, MD conducted the virtual visit and Sherron Ales, PA-C acted as scribe during the service.  Office staff helped with scheduling follow up visits after the service was conducted.   I discussed the limitations of evaluation and management by telemedicine and the availability of in person appointments. The patient expressed understanding and agreed to proceed.  CC: History of Present Illness: Michael Murphyis a 54 y.o.malewith history of psoriatic arthritis.   Review of Systems  Constitutional: Negative for fever and malaise/fatigue.  Eyes: Negative for photophobia, pain, discharge and redness.  Respiratory: Negative for cough, shortness of breath and wheezing.   Cardiovascular: Negative for chest pain and palpitations.  Gastrointestinal: Negative for blood in stool, constipation and diarrhea.  Genitourinary: Negative for dysuria.  Musculoskeletal: Negative for back pain, joint pain, myalgias and neck pain.  Skin: Negative for rash.  Neurological: Negative for dizziness and headaches.  Psychiatric/Behavioral: Negative for depression. The patient is not nervous/anxious and does not have insomnia.      Observations/Objective:  Physical Exam  Constitutional: He is oriented to person, place, and time and well-developed, well-nourished, and in no distress.  HENT:  Head: Normocephalic and atraumatic.  Eyes: Conjunctivae are  normal.  Pulmonary/Chest: Effort normal.  Neurological: He is alert and oriented to person, place, and time.  Psychiatric: Mood, memory, affect and judgment normal.   Patient reports morning stiffness for   {minute/hour:19697}.   Patient {Actions; denies-reports:120008} nocturnal pain.  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 (FAILED ENBREL & Humira).  Other medical conditions are listed as follows:  History of hypertension  History of obesity  Follow Up Instructions: He will follow up in    I discussed the assessment and treatment plan with the patient. The patient was provided an opportunity to ask questions and all were answered. The patient agreed with the plan and demonstrated an understanding of the instructions.   The patient was advised to call back or seek an in-person evaluation if the symptoms worsen or if the condition fails to improve as anticipated.  I provided *** minutes of non-face-to-face time during this encounter.

## 2019-02-12 NOTE — Telephone Encounter (Signed)
Lab Orders released.  

## 2019-02-12 NOTE — Telephone Encounter (Signed)
Patient called requesting labwork orders be sent to [facility] in [place].  Patient states he will be going today 02/11/18.

## 2019-02-13 LAB — CBC WITH DIFFERENTIAL/PLATELET
Basophils Absolute: 0 10*3/uL (ref 0.0–0.2)
Basos: 1 %
EOS (ABSOLUTE): 0.1 10*3/uL (ref 0.0–0.4)
Eos: 1 %
Hematocrit: 42.9 % (ref 37.5–51.0)
Hemoglobin: 14.8 g/dL (ref 13.0–17.7)
Immature Grans (Abs): 0 10*3/uL (ref 0.0–0.1)
Immature Granulocytes: 0 %
Lymphocytes Absolute: 1.9 10*3/uL (ref 0.7–3.1)
Lymphs: 26 %
MCH: 30.3 pg (ref 26.6–33.0)
MCHC: 34.5 g/dL (ref 31.5–35.7)
MCV: 88 fL (ref 79–97)
Monocytes Absolute: 0.9 10*3/uL (ref 0.1–0.9)
Monocytes: 13 %
Neutrophils Absolute: 4.3 10*3/uL (ref 1.4–7.0)
Neutrophils: 59 %
Platelets: 287 10*3/uL (ref 150–450)
RBC: 4.89 x10E6/uL (ref 4.14–5.80)
RDW: 13 % (ref 11.6–15.4)
WBC: 7.3 10*3/uL (ref 3.4–10.8)

## 2019-02-13 LAB — CMP14+EGFR
ALT: 23 IU/L (ref 0–44)
AST: 21 IU/L (ref 0–40)
Albumin/Globulin Ratio: 1.5 (ref 1.2–2.2)
Albumin: 4.2 g/dL (ref 3.8–4.9)
Alkaline Phosphatase: 84 IU/L (ref 39–117)
BUN/Creatinine Ratio: 11 (ref 9–20)
BUN: 13 mg/dL (ref 6–24)
Bilirubin Total: 0.4 mg/dL (ref 0.0–1.2)
CO2: 25 mmol/L (ref 20–29)
Calcium: 9.3 mg/dL (ref 8.7–10.2)
Chloride: 103 mmol/L (ref 96–106)
Creatinine, Ser: 1.14 mg/dL (ref 0.76–1.27)
GFR calc Af Amer: 85 mL/min/{1.73_m2} (ref >59)
GFR calc non Af Amer: 74 mL/min/{1.73_m2} (ref >59)
Globulin, Total: 2.8 g/dL (ref 1.5–4.5)
Glucose: 69 mg/dL (ref 65–99)
Potassium: 4.8 mmol/L (ref 3.5–5.2)
Sodium: 141 mmol/L (ref 134–144)
Total Protein: 7 g/dL (ref 6.0–8.5)

## 2019-02-14 ENCOUNTER — Encounter: Payer: Self-pay | Admitting: Rheumatology

## 2019-02-14 ENCOUNTER — Other Ambulatory Visit: Payer: Self-pay | Admitting: *Deleted

## 2019-02-14 ENCOUNTER — Telehealth (INDEPENDENT_AMBULATORY_CARE_PROVIDER_SITE_OTHER): Payer: BC Managed Care – PPO | Admitting: Rheumatology

## 2019-02-14 ENCOUNTER — Other Ambulatory Visit: Payer: Self-pay

## 2019-02-14 DIAGNOSIS — Z8639 Personal history of other endocrine, nutritional and metabolic disease: Secondary | ICD-10-CM

## 2019-02-14 DIAGNOSIS — Z79899 Other long term (current) drug therapy: Secondary | ICD-10-CM | POA: Diagnosis not present

## 2019-02-14 DIAGNOSIS — L4050 Arthropathic psoriasis, unspecified: Secondary | ICD-10-CM | POA: Diagnosis not present

## 2019-02-14 DIAGNOSIS — L409 Psoriasis, unspecified: Secondary | ICD-10-CM

## 2019-02-14 DIAGNOSIS — Z8679 Personal history of other diseases of the circulatory system: Secondary | ICD-10-CM

## 2019-02-14 DIAGNOSIS — M461 Sacroiliitis, not elsewhere classified: Secondary | ICD-10-CM | POA: Diagnosis not present

## 2019-02-14 MED ORDER — "TUBERCULIN SYRINGE 27G X 1/2"" 1 ML MISC": 3 refills | Status: DC

## 2019-02-14 MED ORDER — METHOTREXATE SODIUM CHEMO INJECTION 50 MG/2ML: INTRAMUSCULAR | 0 refills | Status: DC

## 2019-02-14 NOTE — Progress Notes (Signed)
Virtual Visit via Telephone Note  I connected with Michael Peck on 02/14/19 at 12:00 PM EST by telephone and verified that I am speaking with the correct person using two identifiers.  Location: Patient: Home  Provider: Clinic  This service was conducted via virtual visit.   The patient was located at home. I was located in my office.  Consent was obtained prior to the virtual visit and is aware of possible charges through their insurance for this visit.  The patient is an established patient.  Dr. Estanislado Pandy, MD conducted the virtual visit and Hazel Sams, PA-C acted as scribe during the service.  Office staff helped with scheduling follow up visits after the service was conducted.   I discussed the limitations, risks, security and privacy concerns of performing an evaluation and management service by telephone and the availability of in person appointments. I also discussed with the patient that there may be a patient responsible charge related to this service. The patient expressed understanding and agreed to proceed.  CC: Psoriasis  History of Present Illness: Patient is a 54 year old male with a past medical history of psoriatic arthritis.  He restarted on Cosentyx on 01/16/19 and continues to inject MTX 0.8 ml sq once weekly.  He continues to have left shoulder joint and left wrist joint pain.  He states the discomfort is primarily in the left [facility] joint.  He denies any joint swelling.  He denies any SI joint pain.  He denies any achilles tendonitis or plantar fasciitis.  He states he has noticed 75% improvement in his plaques of psoriasis since restarting on cosentyx.     Review of Systems  Constitutional: Negative for fever and malaise/fatigue.  HENT: Negative for ear discharge and ear pain.   Eyes: Negative for pain and redness.  Respiratory: Negative for cough and shortness of breath.   Cardiovascular: Negative for chest pain and palpitations.  Gastrointestinal: Negative for blood in  stool, constipation, diarrhea, nausea and vomiting.  Genitourinary: Negative for dysuria and urgency.  Musculoskeletal: Positive for joint pain. Negative for back pain and neck pain.  Skin: Positive for rash (Psoriasis ).  Neurological: Negative for dizziness, weakness and headaches.  Endo/Heme/Allergies: Does not bruise/bleed easily.  Psychiatric/Behavioral: Negative for depression and memory loss. The patient is not nervous/anxious and does not have insomnia.    Observations/Objective: Physical Exam  Constitutional: He is oriented to person, place, and time.  Neurological: He is alert and oriented to person, place, and time.  Psychiatric: Mood, memory, affect and judgment normal.    Patient reports morning stiffness for  few hours.   Patient denies nocturnal pain.  Difficulty dressing/grooming: Denies Difficulty climbing stairs: Denies Difficulty getting out of chair: Denies Difficulty using hands for taps, buttons, cutlery, and/or writing: Denies    Assessment and Plan: Visit Diagnoses:Psoriatic arthritis ([place])- Erosive disease with dactylitis and sacroiliitis in the past:  He is experiencing mild discomfort in the left shoulder and left wrist joint.  He has no inflammation at this time.  No achilles tendonitis or plantar fasciitis.  No SI joint discomfort.  He was previously holding Cosentyx since April 2020 but restarted cosentyx in the office on 01/16/19.  He continues to inject MTX 0.8 ml sq once weekly and takes folic acid 2 mg po daily.  He has extensive psoriasis on bilateral lower extremities, scalp, and bilateral elbows, which has been improving significantly since restarting Cosentyx.  He has noticed 75% improvement in his psoriasis.  He will continue injecting  Cosentyx 300 mg sq once monthly, MTX 0.8 ml sq once weekly, and folic acid 2 mg po daily.  He will follow up in 3 months.   Psoriasis: The plaques on his lower extremities, scalp, and both elbows are improving since  restarting on Cosentyx on 01/16/19.  He has noticed 75% improvement in the plaques.   Sacroiliitis [facility]): He has no SI joint pain at this time.   High risk medication use - Cosentyx 300 mg sq q month, Methotrexate 0.8 ml sq q wk, Folic acid 2 mg po qd. (Failed Enbrel & Humira).  CBC and CMP were drawn on 02/12/19.  He will be due to update lab work in April and every 3 months.  TB gold negative on 01/08/19.   Other medical conditions are listed as follows:  History of hypertension  History of obesity Follow Up Instructions: He will follow up in 3 months.    I discussed the assessment and treatment plan with the patient. The patient was provided an opportunity to ask questions and all were answered. The patient agreed with the plan and demonstrated an understanding of the instructions.   The patient was advised to call back or seek an in-person evaluation if the symptoms worsen or if the condition fails to improve as anticipated.  I provided 15 minutes of non-face-to-face time during this encounter.  Pollyann Savoy, MD   Scribed by- Sherron Ales, PA-C

## 2019-02-15 ENCOUNTER — Telehealth: Payer: BC Managed Care – PPO | Admitting: Rheumatology

## 2019-02-28 ENCOUNTER — Other Ambulatory Visit: Payer: Self-pay | Admitting: Rheumatology

## 2019-02-28 NOTE — Telephone Encounter (Signed)
Last Visit: 02/14/19 Next Visit: 05/16/19 Labs: 02/12/19 CBC and CMP WNL TB Gold: 01/08/19 Neg   Okay to refill per Dr. Corliss Skains

## 2019-03-11 ENCOUNTER — Other Ambulatory Visit: Payer: Self-pay | Admitting: Rheumatology

## 2019-03-11 NOTE — Telephone Encounter (Signed)
Last Visit: 01/01/19 Next Visit: 05/16/19 Labs: 02/12/19 WNL   Okay to refill per Dr. Corliss Skains

## 2019-03-19 ENCOUNTER — Other Ambulatory Visit: Payer: Self-pay | Admitting: Rheumatology

## 2019-04-11 ENCOUNTER — Other Ambulatory Visit: Payer: Self-pay | Admitting: Rheumatology

## 2019-05-09 NOTE — Progress Notes (Signed)
Office Visit Note  Patient: Michael Peck             Date of Birth: 12/04/1965           MRN: [id number]             PCP: Jerl Mina, MD Referring: Jerl Mina, MD Visit Date: 05/16/2019 Occupation: @GUAROCC @  Subjective:  Left shoulder joint pain   History of Present Illness: Michael Peck is a 54 y.o. male with history of psoriatic arthritis.  He is on cosentyx 300 mg sq injections once monthly, MTX 0.8 ml sq once weekly, and folic acid 2 mg po daily. He is tolerating both medications without any side effects.  He denies any recent psoriatic arthritis flares. He has persistent left CMC joint and left shoulder joint discomfort.  He denies any joint swelling.  He denies any achilles tendonitis or plantar fasciitis.  He denies any SI joint pain at this time.  He has a few small patches of psoriasis on both elbows.  He does not have any topical agents to apply.   He will be receiving his first covid-19 vaccine tomorrow.   Activities of Daily Living:  Patient reports morning stiffness for 1-2  hours.   Patient Denies nocturnal pain.  Difficulty dressing/grooming: Denies Difficulty climbing stairs: Denies Difficulty getting out of chair: Denies Difficulty using hands for taps, buttons, cutlery, and/or writing: Denies  Review of Systems  Constitutional: Negative for fatigue and night sweats.  HENT: Negative for mouth sores, mouth dryness and nose dryness.   Eyes: Negative for redness and dryness.  Respiratory: Negative for cough, hemoptysis, shortness of breath and difficulty breathing.   Cardiovascular: Negative for chest pain, palpitations, hypertension, irregular heartbeat and swelling in legs/feet.  Gastrointestinal: Negative for blood in stool, constipation and diarrhea.  Endocrine: Negative for increased urination.  Genitourinary: Negative for painful urination.  Musculoskeletal: Negative for arthralgias, joint pain, joint swelling, myalgias, muscle weakness, morning  stiffness, muscle tenderness and myalgias.  Skin: Positive for rash (Psoriasis ). Negative for color change, hair loss, nodules/bumps, skin tightness, ulcers and sensitivity to sunlight.  Allergic/Immunologic: Negative for susceptible to infections.  Neurological: Negative for dizziness, fainting, memory loss, night sweats and weakness.  Hematological: Negative for swollen glands.  Psychiatric/Behavioral: Negative for depressed mood and sleep disturbance. The patient is not nervous/anxious.     PMFS History:  Patient Active Problem List   Diagnosis Date Noted  . High risk medication use 12/10/2015  . Low back pain 12/10/2015  . Obesity 12/10/2015  . Psoriasis 12/09/2015  . Psoriatic arthritis (HCC) 12/09/2015    Past Medical History:  Diagnosis Date  . Arthritis   . Hypertension   . Psoriasis 12/09/2015  . Psoriatic arthritis (HCC) 12/09/2015   Failed Enbrel    Family History  Problem Relation Age of Onset  . Epilepsy Mother   . Cancer Father        prostate   . Healthy Daughter    Past Surgical History:  Procedure Laterality Date  . ADENOIDECTOMY     Social History   Social History Narrative  . Not on file   Immunization History  Administered Date(s) Administered  . Influenza Inj Mdck Quad Pf 11/07/2016, 11/03/2017     Objective: Vital Signs: BP 126/80 (BP Location: Left Arm, Patient Position: Sitting, Cuff Size: Small)   Pulse (!) 49   Resp 12   Ht 5\' 10"  (1.778 m)   BMI 32.94 kg/m    Physical Exam Vitals and  nursing note reviewed.  Constitutional:      Appearance: He is well-developed.  HENT:     Head: Normocephalic and atraumatic.  Eyes:     Conjunctiva/sclera: Conjunctivae normal.     Pupils: Pupils are equal, round, and reactive to light.  Pulmonary:     Effort: Pulmonary effort is normal.  Abdominal:     General: Bowel sounds are normal.     Palpations: Abdomen is soft.  Musculoskeletal:     Cervical back: Normal range of motion and neck  supple.  Skin:    General: Skin is warm and dry.     Capillary Refill: Capillary refill takes less than 2 seconds.     Comments: Fingernail pitting noted.  Nail dystrophy in toenails.  Small patches of psoriasis on the extensor surface of both elbows.   Neurological:     Mental Status: He is alert and oriented to person, place, and time.  Psychiatric:        Behavior: Behavior normal.      Musculoskeletal Exam: C-spine, thoracic spine, and lumbar spine good ROM. No midline spinal tenderness. No SI joint tenderness. Shoulder joints, elbow joints, wrist joints, MCPs, PIPs, and DIPs good ROM with no synovitis.  Complete fist formation bilaterally.  Hip joints, knee joints, ankle joints, MTPs, PIPs, and DIPs good ROM with no synovitis.  No warmth or effusion of knee joints.  No tenderness or swelling of ankle joints.  No achilles tendonitis or plantar fasciitis. Hammertoes noted bilaterally.    CDAI Exam: CDAI Score: - Patient Global: -; Provider Global: - Swollen: -; Tender: - Joint Exam 05/16/2019   No joint exam has been documented for this visit   There is currently no information documented on the homunculus. Go to the Rheumatology activity and complete the homunculus joint exam.  Investigation: No additional findings.  Imaging: No results found.  Recent Labs: Lab Results  Component Value Date   WBC 7.3 02/12/2019   HGB 14.8 02/12/2019   PLT 287 02/12/2019   NA 141 02/12/2019   K 4.8 02/12/2019   CL 103 02/12/2019   CO2 25 02/12/2019   GLUCOSE 69 02/12/2019   BUN 13 02/12/2019   CREATININE 1.14 02/12/2019   BILITOT 0.4 02/12/2019   ALKPHOS 84 02/12/2019   AST 21 02/12/2019   ALT 23 02/12/2019   PROT 7.0 02/12/2019   ALBUMIN 4.2 02/12/2019   CALCIUM 9.3 02/12/2019   GFRAA 85 02/12/2019   QFTBGOLDPLUS Negative 01/08/2019    Speciality Comments: No specialty comments available.  Procedures:  No procedures performed Allergies: Patient has no known allergies.    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  He has not had any recent psoriatic arthritis flares.  He presents today with left CMC joint pain due to underlying osteoarthritis.  He has no Achilles tendinitis or plantar fasciitis.  No SI joint tenderness on exam.  He is clinically been doing well on Cosentyx 300 mg subcutaneous injections once monthly, methotrexate 0.8 mL subcu ductions once weekly, folic acid 2 mg by mouth daily.  He has not missed any doses of these medications and has been tolerating them without any side effects.  He has a few small patches of psoriasis on the extensor surface of both elbow joints.  A prescription for clobetasol cream was sent to the pharmacy.  He will continue on the  current treatment regimen.  He was advised to notify us if he develops increased joint pain or joint swelling.  He will follow-up in the office in 5 months.  Psoriasis - He has a few small patches of psoriasis on the extensor surface of both elbows.  A prescription of clobetasol cream 0.05% was sent to the pharmacy. He has fingernail pitting and nail dystrophy in his toenails.   High risk medication use - Cosentyx 300 mg sq q month, Methotrexate 0.8 ml sq q wk, Folic acid 2 mg po qd. (Failed Enbrel & Humira).  CBC and CMP were drawn today.  He will return in July and every 3 months for lab work to monitor for drug toxicity. TB gold negative on 01/08/19.  He will be receiving his first covid-19 vaccination tomorrow. - Plan: CBC with Differential/Platelet, COMPLETE METABOLIC PANEL WITH GFR  Sacroiliitis ([place]): No SI joint tenderness or discomfort at this time.   Other medical conditions are listed as follows:   History of obesity  History of hypertension  Orders: Orders Placed This Encounter  Procedures  . CBC with Differential/Platelet  . COMPLETE METABOLIC PANEL WITH GFR    Meds ordered this encounter  Medications  . clobetasol cream (TEMOVATE) 0.05 %    Sig: Apply 1 application topically 2 (two) times daily.    Dispense:  30 g    Refill:  0      Follow-Up Instructions: Return in about 5 months (around 10/16/2019) for Psoriatic arthritis.   Ofilia Neas, PA-C  Note - This record has been created using Dragon software.  Chart creation errors have been sought, but may not always  have been located. Such creation errors do not reflect on  the standard of medical care.

## 2019-05-16 ENCOUNTER — Other Ambulatory Visit: Payer: Self-pay

## 2019-05-16 ENCOUNTER — Encounter: Payer: Self-pay | Admitting: Physician Assistant

## 2019-05-16 ENCOUNTER — Ambulatory Visit (INDEPENDENT_AMBULATORY_CARE_PROVIDER_SITE_OTHER): Payer: BC Managed Care – PPO | Admitting: Physician Assistant

## 2019-05-16 VITALS — BP 126/80 | HR 49 | Resp 12 | Ht 70.0 in | Wt 220.6 lb

## 2019-05-16 DIAGNOSIS — L4050 Arthropathic psoriasis, unspecified: Secondary | ICD-10-CM | POA: Diagnosis not present

## 2019-05-16 DIAGNOSIS — Z79899 Other long term (current) drug therapy: Secondary | ICD-10-CM | POA: Diagnosis not present

## 2019-05-16 DIAGNOSIS — L409 Psoriasis, unspecified: Secondary | ICD-10-CM | POA: Diagnosis not present

## 2019-05-16 DIAGNOSIS — M461 Sacroiliitis, not elsewhere classified: Secondary | ICD-10-CM | POA: Diagnosis not present

## 2019-05-16 DIAGNOSIS — Z8639 Personal history of other endocrine, nutritional and metabolic disease: Secondary | ICD-10-CM

## 2019-05-16 DIAGNOSIS — Z8679 Personal history of other diseases of the circulatory system: Secondary | ICD-10-CM

## 2019-05-16 LAB — CBC WITH DIFFERENTIAL/PLATELET
Absolute Monocytes: 651 cells/uL (ref 200–950)
Basophils Absolute: 31 cells/uL (ref 0–200)
Basophils Relative: 0.5 %
Eosinophils Absolute: 81 cells/uL (ref 15–500)
Eosinophils Relative: 1.3 %
HCT: 42.7 % (ref 38.5–50.0)
Hemoglobin: 14 g/dL (ref 13.2–17.1)
Lymphs Abs: 1587 cells/uL (ref 850–3900)
MCH: 30.4 pg (ref 27.0–33.0)
MCHC: 32.8 g/dL (ref 32.0–36.0)
MCV: 92.6 fL (ref 80.0–100.0)
MPV: 11 fL (ref 7.5–12.5)
Monocytes Relative: 10.5 %
Neutro Abs: 3850 cells/uL (ref 1500–7800)
Neutrophils Relative %: 62.1 %
Platelets: 225 10*3/uL (ref 140–400)
RBC: 4.61 10*6/uL (ref 4.20–5.80)
RDW: 14.5 % (ref 11.0–15.0)
Total Lymphocyte: 25.6 %
WBC: 6.2 10*3/uL (ref 3.8–10.8)

## 2019-05-16 LAB — COMPLETE METABOLIC PANEL WITH GFR
AG Ratio: 1.6 (calc) (ref 1.0–2.5)
ALT: 16 U/L (ref 9–46)
AST: 16 U/L (ref 10–35)
Albumin: 4 g/dL (ref 3.6–5.1)
Alkaline phosphatase (APISO): 65 U/L (ref 35–144)
BUN: 17 mg/dL (ref 7–25)
CO2: 27 mmol/L (ref 20–32)
Calcium: 9 mg/dL (ref 8.6–10.3)
Chloride: 106 mmol/L (ref 98–110)
Creat: 0.97 mg/dL (ref 0.70–1.33)
GFR, Est African American: 103 mL/min/{1.73_m2} (ref >=60)
GFR, Est Non African American: 89 mL/min/{1.73_m2} (ref >=60)
Globulin: 2.5 g/dL (calc) (ref 1.9–3.7)
Glucose, Bld: 96 mg/dL (ref 65–99)
Potassium: 4.2 mmol/L (ref 3.5–5.3)
Sodium: 139 mmol/L (ref 135–146)
Total Bilirubin: 0.6 mg/dL (ref 0.2–1.2)
Total Protein: 6.5 g/dL (ref 6.1–8.1)

## 2019-05-16 MED ORDER — CLOBETASOL PROPIONATE 0.05 % EX CREA: 1.0000 "application " | TOPICAL_CREAM | Freq: Two times a day (BID) | CUTANEOUS | 0 refills | Status: DC

## 2019-05-16 NOTE — Patient Instructions (Signed)
Standing Labs We placed an order today for your standing lab work.    Please come back and get your standing labs in July and every 3 months   We have open lab daily Monday through Thursday from 8:30-12:30 PM and 1:30-4:30 PM and Friday from 8:30-12:30 PM and 1:30-4:00 PM at the office of Dr. Pollyann Savoy.   You may experience shorter wait times on Monday and Friday afternoons. The office is located at [address], [address], [place], [place] [address] No appointment is necessary.   Labs are drawn by [facility].  You may receive a bill from [facility] for your lab work.  If you wish to have your labs drawn at another location, please call the office 24 hours in advance to send orders.  If you have any questions regarding directions or hours of operation,  please call [phone].   Just as a reminder please drink plenty of water prior to coming for your lab work. Thanks!    Shoulder Exercises Ask your health care provider which exercises are safe for you. Do exercises exactly as told by your health care provider and adjust them as directed. It is normal to feel mild stretching, pulling, tightness, or discomfort as you do these exercises. Stop right away if you feel sudden pain or your pain gets worse. Do not begin these exercises until told by your health care provider. Stretching exercises External rotation and abduction This exercise is sometimes called corner stretch. This exercise rotates your arm outward (external rotation) and moves your arm out from your body (abduction). 1. Stand in a doorway with one of your feet slightly in front of the other. This is called a staggered stance. If you cannot reach your forearms to the door frame, stand facing a corner of a room. 2. Choose one of the following positions as told by your health care provider: ? Place your hands and forearms on the door frame above your head. ? Place your hands and forearms on the door frame at the height of your  head. ? Place your hands on the door frame at the height of your elbows. 3. Slowly move your weight onto your front foot until you feel a stretch across your chest and in the front of your shoulders. Keep your head and chest upright and keep your abdominal muscles tight. 4. Hold for __________ seconds. 5. To release the stretch, shift your weight to your back foot. Repeat __________ times. Complete this exercise __________ times a day. Extension, standing 1. Stand and hold a broomstick, a cane, or a similar object behind your back. ? Your hands should be a little wider than shoulder width apart. ? Your palms should face away from your back. 2. Keeping your elbows straight and your shoulder muscles relaxed, move the stick away from your body until you feel a stretch in your shoulders (extension). ? Avoid shrugging your shoulders while you move the stick. Keep your shoulder blades tucked down toward the middle of your back. 3. Hold for __________ seconds. 4. Slowly return to the starting position. Repeat __________ times. Complete this exercise __________ times a day. Range-of-motion exercises Pendulum  1. Stand near a wall or a surface that you can hold onto for balance. 2. Bend at the waist and let your left / right arm hang straight down. Use your other arm to support you. Keep your back straight and do not lock your knees. 3. Relax your left / right arm and shoulder muscles, and move  your hips and your trunk so your left / right arm swings freely. Your arm should swing because of the motion of your body, not because you are using your arm or shoulder muscles. 4. Keep moving your hips and trunk so your arm swings in the following directions, as told by your health care provider: ? Side to side. ? Forward and backward. ? In clockwise and counterclockwise circles. 5. Continue each motion for __________ seconds, or for as long as told by your health care provider. 6. Slowly return to the  starting position. Repeat __________ times. Complete this exercise __________ times a day. Shoulder flexion, standing  1. Stand and hold a broomstick, a cane, or a similar object. Place your hands a little more than shoulder width apart on the object. Your left / right hand should be palm up, and your other hand should be palm down. 2. Keep your elbow straight and your shoulder muscles relaxed. Push the stick up with your healthy arm to raise your left / right arm in front of your body, and then over your head until you feel a stretch in your shoulder (flexion). ? Avoid shrugging your shoulder while you raise your arm. Keep your shoulder blade tucked down toward the middle of your back. 3. Hold for __________ seconds. 4. Slowly return to the starting position. Repeat __________ times. Complete this exercise __________ times a day. Shoulder abduction, standing 1. Stand and hold a broomstick, a cane, or a similar object. Place your hands a little more than shoulder width apart on the object. Your left / right hand should be palm up, and your other hand should be palm down. 2. Keep your elbow straight and your shoulder muscles relaxed. Push the object across your body toward your left / right side. Raise your left / right arm to the side of your body (abduction) until you feel a stretch in your shoulder. ? Do not raise your arm above shoulder height unless your health care provider tells you to do that. ? If directed, raise your arm over your head. ? Avoid shrugging your shoulder while you raise your arm. Keep your shoulder blade tucked down toward the middle of your back. 3. Hold for __________ seconds. 4. Slowly return to the starting position. Repeat __________ times. Complete this exercise __________ times a day. Internal rotation  1. Place your left / right hand behind your back, palm up. 2. Use your other hand to dangle an exercise band, a towel, or a similar object over your shoulder. Grasp  the band with your left / right hand so you are holding on to both ends. 3. Gently pull up on the band until you feel a stretch in the front of your left / right shoulder. The movement of your arm toward the center of your body is called internal rotation. ? Avoid shrugging your shoulder while you raise your arm. Keep your shoulder blade tucked down toward the middle of your back. 4. Hold for __________ seconds. 5. Release the stretch by letting go of the band and lowering your hands. Repeat __________ times. Complete this exercise __________ times a day. Strengthening exercises External rotation  1. Sit in a stable chair without armrests. 2. Secure an exercise band to a stable object at elbow height on your left / right side. 3. Place a soft object, such as a folded towel or a small pillow, between your left / right upper arm and your body to move your elbow about 4  inches (10 cm) away from your side. 4. Hold the end of the exercise band so it is tight and there is no slack. 5. Keeping your elbow pressed against the soft object, slowly move your forearm out, away from your abdomen (external rotation). Keep your body steady so only your forearm moves. 6. Hold for __________ seconds. 7. Slowly return to the starting position. Repeat __________ times. Complete this exercise __________ times a day. Shoulder abduction  1. Sit in a stable chair without armrests, or stand up. 2. Hold a __________ weight in your left / right hand, or hold an exercise band with both hands. 3. Start with your arms straight down and your left / right palm facing in, toward your body. 4. Slowly lift your left / right hand out to your side (abduction). Do not lift your hand above shoulder height unless your health care provider tells you that this is safe. ? Keep your arms straight. ? Avoid shrugging your shoulder while you do this movement. Keep your shoulder blade tucked down toward the middle of your back. 5. Hold  for __________ seconds. 6. Slowly lower your arm, and return to the starting position. Repeat __________ times. Complete this exercise __________ times a day. Shoulder extension 1. Sit in a stable chair without armrests, or stand up. 2. Secure an exercise band to a stable object in front of you so it is at shoulder height. 3. Hold one end of the exercise band in each hand. Your palms should face each other. 4. Straighten your elbows and lift your hands up to shoulder height. 5. Step back, away from the secured end of the exercise band, until the band is tight and there is no slack. 6. Squeeze your shoulder blades together as you pull your hands down to the sides of your thighs (extension). Stop when your hands are straight down by your sides. Do not let your hands go behind your body. 7. Hold for __________ seconds. 8. Slowly return to the starting position. Repeat __________ times. Complete this exercise __________ times a day. Shoulder row 1. Sit in a stable chair without armrests, or stand up. 2. Secure an exercise band to a stable object in front of you so it is at waist height. 3. Hold one end of the exercise band in each hand. Position your palms so that your thumbs are facing the ceiling (neutral position). 4. Bend each of your elbows to a 90-degree angle (right angle) and keep your upper arms at your sides. 5. Step back until the band is tight and there is no slack. 6. Slowly pull your elbows back behind you. 7. Hold for __________ seconds. 8. Slowly return to the starting position. Repeat __________ times. Complete this exercise __________ times a day. Shoulder press-ups  1. Sit in a stable chair that has armrests. Sit upright, with your feet flat on the floor. 2. Put your hands on the armrests so your elbows are bent and your fingers are pointing forward. Your hands should be about even with the sides of your body. 3. Push down on the armrests and use your arms to lift yourself  off the chair. Straighten your elbows and lift yourself up as much as you comfortably can. ? Move your shoulder blades down, and avoid letting your shoulders move up toward your ears. ? Keep your feet on the ground. As you get stronger, your feet should support less of your body weight as you lift yourself up. 4. Hold for __________  seconds. 5. Slowly lower yourself back into the chair. Repeat __________ times. Complete this exercise __________ times a day. Wall push-ups  1. Stand so you are facing a stable wall. Your feet should be about one arm-length away from the wall. 2. Lean forward and place your palms on the wall at shoulder height. 3. Keep your feet flat on the floor as you bend your elbows and lean forward toward the wall. 4. Hold for __________ seconds. 5. Straighten your elbows to push yourself back to the starting position. Repeat __________ times. Complete this exercise __________ times a day. This information is not intended to replace advice given to you by your health care provider. Make sure you discuss any questions you have with your health care provider. Document Revised: 05/18/2018 Document Reviewed: 02/23/2018 Elsevier Patient Education  2020 [employer]  Hand Exercises Hand exercises can be helpful for almost anyone. These exercises can strengthen the hands, improve flexibility and movement, and increase blood flow to the hands. These results can make work and daily tasks easier. Hand exercises can be especially helpful for people who have joint pain from arthritis or have nerve damage from overuse (carpal tunnel syndrome). These exercises can also help people who have injured a hand. Exercises Most of these hand exercises are gentle stretching and motion exercises. It is usually safe to do them often throughout the day. Warming up your hands before exercise may help to reduce stiffness. You can do this with gentle massage or by placing your hands in warm water for  10-15 minutes. It is normal to feel some stretching, pulling, tightness, or mild discomfort as you begin new exercises. This will gradually improve. Stop an exercise right away if you feel sudden, severe pain or your pain gets worse. Ask your health care provider which exercises are best for you. Knuckle bend or "claw" fist 1. Stand or sit with your arm, hand, and all five fingers pointed straight up. Make sure to keep your wrist straight during the exercise. 2. Gently bend your fingers down toward your palm until the tips of your fingers are touching the top of your palm. Keep your big knuckle straight and just bend the small knuckles in your fingers. 3. Hold this position for __________ seconds. 4. Straighten (extend) your fingers back to the starting position. Repeat this exercise 5-10 times with each hand. Full finger fist 1. Stand or sit with your arm, hand, and all five fingers pointed straight up. Make sure to keep your wrist straight during the exercise. 2. Gently bend your fingers into your palm until the tips of your fingers are touching the middle of your palm. 3. Hold this position for __________ seconds. 4. Extend your fingers back to the starting position, stretching every joint fully. Repeat this exercise 5-10 times with each hand. Straight fist 1. Stand or sit with your arm, hand, and all five fingers pointed straight up. Make sure to keep your wrist straight during the exercise. 2. Gently bend your fingers at the big knuckle, where your fingers meet your hand, and the middle knuckle. Keep the knuckle at the tips of your fingers straight and try to touch the bottom of your palm. 3. Hold this position for __________ seconds. 4. Extend your fingers back to the starting position, stretching every joint fully. Repeat this exercise 5-10 times with each hand. Tabletop 1. Stand or sit with your arm, hand, and all five fingers pointed straight up. Make sure to keep your wrist straight  during the exercise. 2. Gently bend your fingers at the big knuckle, where your fingers meet your hand, as far down as you can while keeping the small knuckles in your fingers straight. Think of forming a tabletop with your fingers. 3. Hold this position for __________ seconds. 4. Extend your fingers back to the starting position, stretching every joint fully. Repeat this exercise 5-10 times with each hand. Finger spread 1. Place your hand flat on a table with your palm facing down. Make sure your wrist stays straight as you do this exercise. 2. Spread your fingers and thumb apart from each other as far as you can until you feel a gentle stretch. Hold this position for __________ seconds. 3. Bring your fingers and thumb tight together again. Hold this position for __________ seconds. Repeat this exercise 5-10 times with each hand. Making circles 1. Stand or sit with your arm, hand, and all five fingers pointed straight up. Make sure to keep your wrist straight during the exercise. 2. Make a circle by touching the tip of your thumb to the tip of your index finger. 3. Hold for __________ seconds. Then open your hand wide. 4. Repeat this motion with your thumb and each finger on your hand. Repeat this exercise 5-10 times with each hand. Thumb motion 1. Sit with your forearm resting on a table and your wrist straight. Your thumb should be facing up toward the ceiling. Keep your fingers relaxed as you move your thumb. 2. Lift your thumb up as high as you can toward the ceiling. Hold for __________ seconds. 3. Bend your thumb across your palm as far as you can, reaching the tip of your thumb for the small finger (pinkie) side of your palm. Hold for __________ seconds. Repeat this exercise 5-10 times with each hand. Grip strengthening  1. Hold a stress ball or other soft ball in the middle of your hand. 2. Slowly increase the pressure, squeezing the ball as much as you can without causing pain.  Think of bringing the tips of your fingers into the middle of your palm. All of your finger joints should bend when doing this exercise. 3. Hold your squeeze for __________ seconds, then relax. Repeat this exercise 5-10 times with each hand. Contact a health care provider if:  Your hand pain or discomfort gets much worse when you do an exercise.  Your hand pain or discomfort does not improve within 2 hours after you exercise. If you have any of these problems, stop doing these exercises right away. Do not do them again unless your health care provider says that you can. Get help right away if:  You develop sudden, severe hand pain or swelling. If this happens, stop doing these exercises right away. Do not do them again unless your health care provider says that you can. This information is not intended to replace advice given to you by your health care provider. Make sure you discuss any questions you have with your health care provider. Document Revised: 05/17/2018 Document Reviewed: 01/25/2018 Elsevier Patient Education  [place].

## 2019-05-17 ENCOUNTER — Ambulatory Visit: Payer: BC Managed Care – PPO | Attending: Internal Medicine

## 2019-05-17 DIAGNOSIS — Z23 Encounter for immunization: Secondary | ICD-10-CM

## 2019-05-17 NOTE — Progress Notes (Signed)
CBC and CMP WNL

## 2019-05-17 NOTE — Progress Notes (Signed)
   Covid-19 Vaccination Clinic  Name:  Alyjah Lovingood    MRN: [id number] DOB: 09/13/1965  05/17/2019  Mr. Cosman was observed post Covid-19 immunization for 15 minutes without incident. He was provided with Vaccine Information Sheet and instruction to access the V-Safe system.   Mr. Turnbaugh was instructed to call 911 with any severe reactions post vaccine: Marland Kitchen Difficulty breathing  . Swelling of face and throat  . A fast heartbeat  . A bad rash all over body  . Dizziness and weakness   Immunizations Administered    Name Date Dose VIS Date Route   Pfizer COVID-19 Vaccine 05/17/2019  8:43 AM 0.3 mL 01/18/2019 Intramuscular   Manufacturer: [employer], [employer]   Lot: [id number]   NDC: [address]-7

## 2019-05-20 ENCOUNTER — Other Ambulatory Visit: Payer: Self-pay | Admitting: Rheumatology

## 2019-06-12 ENCOUNTER — Ambulatory Visit: Payer: BC Managed Care – PPO | Attending: Internal Medicine

## 2019-06-12 DIAGNOSIS — Z23 Encounter for immunization: Secondary | ICD-10-CM

## 2019-06-12 NOTE — Progress Notes (Signed)
   Covid-19 Vaccination Clinic  Name:  Michael Peck    MRN: [id number] DOB: 07-01-1965  06/12/2019  Mr. Michael Peck was observed post Covid-19 immunization for 15 minutes without incident. He was provided with Vaccine Information Sheet and instruction to access the V-Safe system.   Mr. Michael Peck was instructed to call 911 with any severe reactions post vaccine: Marland Kitchen Difficulty breathing  . Swelling of face and throat  . A fast heartbeat  . A bad rash all over body  . Dizziness and weakness   Immunizations Administered    Name Date Dose VIS Date Route   Pfizer COVID-19 Vaccine 06/12/2019  8:41 AM 0.3 mL 04/03/2018 Intramuscular   Manufacturer: [employer], [employer]   Lot: [id number]   NDC: [address]-1

## 2019-06-22 ENCOUNTER — Other Ambulatory Visit: Payer: Self-pay | Admitting: Rheumatology

## 2019-06-24 NOTE — Telephone Encounter (Signed)
Last Visit: 05/16/2019 Next Visit: 10/17/2019 Labs: 05/16/2019 WNL  Current Dose per office note 05/16/2019: methotrexate 0.8 mL SQ once weekly  Okay to refill per Dr. Corliss Skains

## 2019-07-23 ENCOUNTER — Other Ambulatory Visit: Payer: Self-pay | Admitting: *Deleted

## 2019-07-23 DIAGNOSIS — Z79899 Other long term (current) drug therapy: Secondary | ICD-10-CM

## 2019-07-23 LAB — CBC WITH DIFFERENTIAL/PLATELET
Absolute Monocytes: 555 cells/uL (ref 200–950)
Basophils Absolute: 30 cells/uL (ref 0–200)
Basophils Relative: 0.5 %
Eosinophils Absolute: 100 cells/uL (ref 15–500)
Eosinophils Relative: 1.7 %
HCT: 40.2 % (ref 38.5–50.0)
Hemoglobin: 13.6 g/dL (ref 13.2–17.1)
Lymphs Abs: 1581 cells/uL (ref 850–3900)
MCH: 31.2 pg (ref 27.0–33.0)
MCHC: 33.8 g/dL (ref 32.0–36.0)
MCV: 92.2 fL (ref 80.0–100.0)
MPV: 11.3 fL (ref 7.5–12.5)
Monocytes Relative: 9.4 %
Neutro Abs: 3634 cells/uL (ref 1500–7800)
Neutrophils Relative %: 61.6 %
Platelets: 221 10*3/uL (ref 140–400)
RBC: 4.36 10*6/uL (ref 4.20–5.80)
RDW: 13.5 % (ref 11.0–15.0)
Total Lymphocyte: 26.8 %
WBC: 5.9 10*3/uL (ref 3.8–10.8)

## 2019-07-23 LAB — COMPLETE METABOLIC PANEL WITH GFR
AG Ratio: 1.6 (calc) (ref 1.0–2.5)
ALT: 14 U/L (ref 9–46)
AST: 14 U/L (ref 10–35)
Albumin: 4 g/dL (ref 3.6–5.1)
Alkaline phosphatase (APISO): 67 U/L (ref 35–144)
BUN: 15 mg/dL (ref 7–25)
CO2: 26 mmol/L (ref 20–32)
Calcium: 9.1 mg/dL (ref 8.6–10.3)
Chloride: 107 mmol/L (ref 98–110)
Creat: 0.97 mg/dL (ref 0.70–1.33)
GFR, Est African American: 103 mL/min/{1.73_m2} (ref >=60)
GFR, Est Non African American: 89 mL/min/{1.73_m2} (ref >=60)
Globulin: 2.5 g/dL (calc) (ref 1.9–3.7)
Glucose, Bld: 108 mg/dL — ABNORMAL HIGH (ref 65–99)
Potassium: 4.4 mmol/L (ref 3.5–5.3)
Sodium: 140 mmol/L (ref 135–146)
Total Bilirubin: 0.4 mg/dL (ref 0.2–1.2)
Total Protein: 6.5 g/dL (ref 6.1–8.1)

## 2019-07-24 NOTE — Progress Notes (Signed)
Labs are within normal limits.

## 2019-08-30 ENCOUNTER — Telehealth: Payer: Self-pay | Admitting: Pharmacy Technician

## 2019-08-30 NOTE — Telephone Encounter (Signed)
Submitted a Prior Authorization request to [facility] for COSENTYX via Cover My Meds. Will update once we receive a response.   PA# [phone]

## 2019-09-09 NOTE — Telephone Encounter (Signed)
Received notification from [facility] regarding a prior authorization for COSENTYX. Authorization has been APPROVED from 09/09/19 to 09/08/20.   Authorization # [id number]

## 2019-09-18 ENCOUNTER — Other Ambulatory Visit: Payer: Self-pay | Admitting: Rheumatology

## 2019-09-28 IMAGING — CT CT ANGIO CHEST
2 of 6 series · 19 of 46 positions shown · IV contrast (APPLIED)
Comparison: Same day CXR

CLINICAL DATA: [DATE] episodes of severe stabbing pain occurred while
at work on the computer.

EXAM:
CT ANGIOGRAPHY CHEST WITH CONTRAST
TECHNIQUE: Multidetector CT imaging of the chest was performed using the
standard protocol during bolus administration of intravenous
contrast. Multiplanar CT image reconstructions and MIPs were
obtained to evaluate the vascular anatomy.
CONTRAST:  75mL [id number] IOPAMIDOL ([id number]) INJECTION 76%

[Series 5: thins · axial · 0.78mm/px · z∈[-322,-50]mm · 17 of 298 slices shown]
[im 13/298  lung]
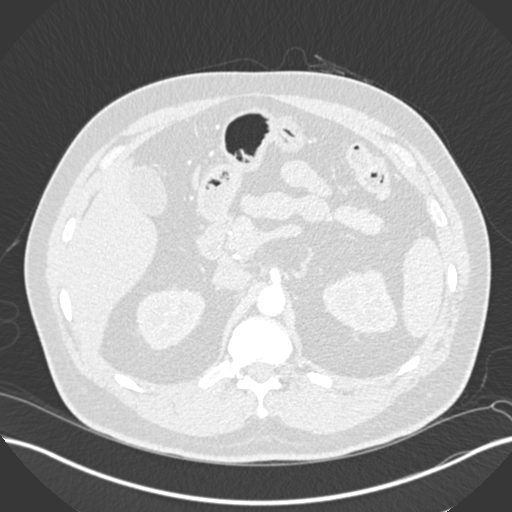
[im 26/298  soft-tissue]
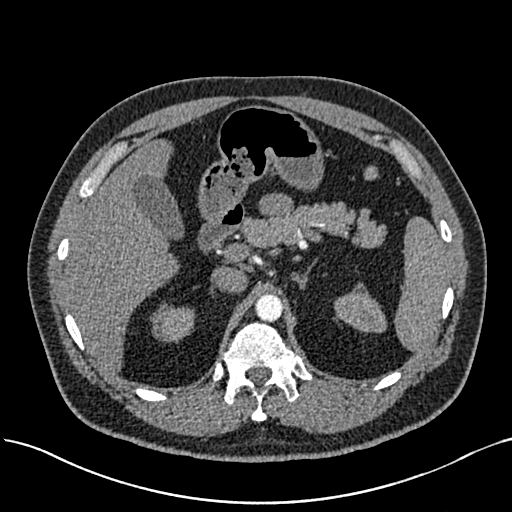
[im 52/298  lung]
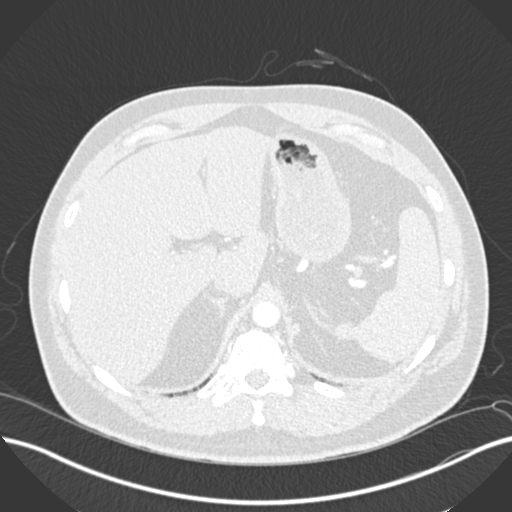
[im 65/298  soft-tissue]
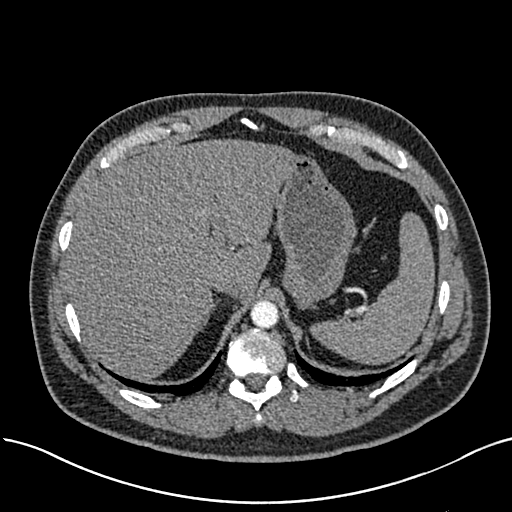
[im 78/298  lung]
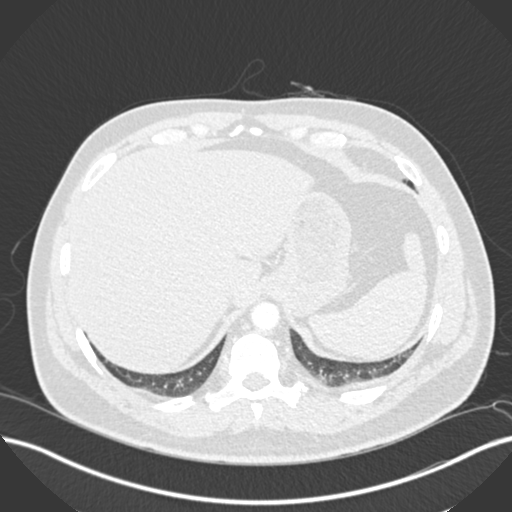
[im 104/298  soft-tissue]
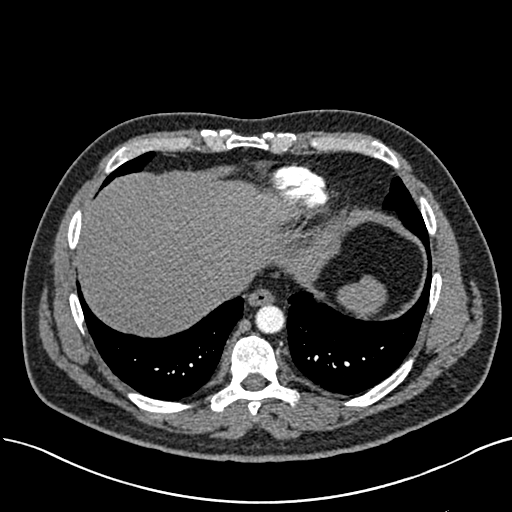
[im 117/298  lung]
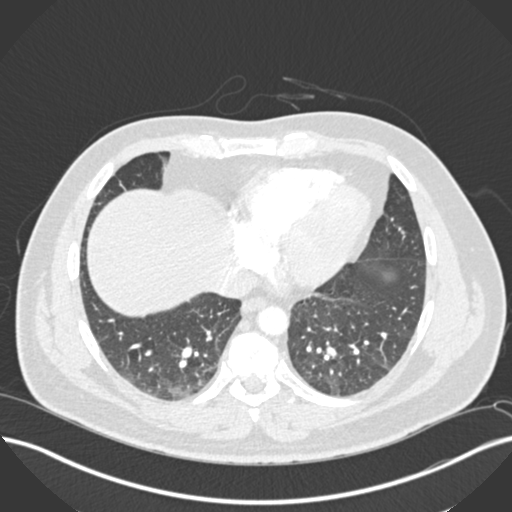
[im 130/298  soft-tissue]
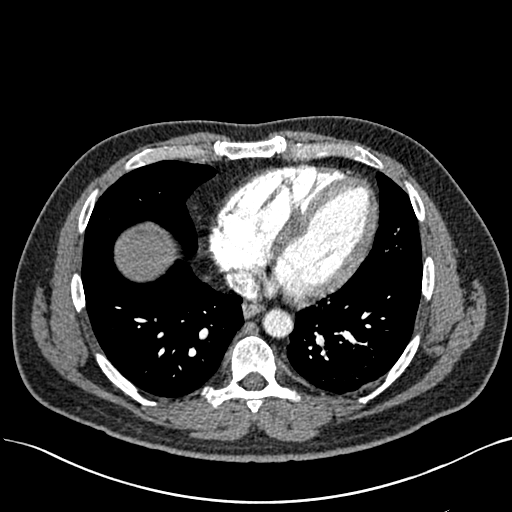
[im 155/298  lung]
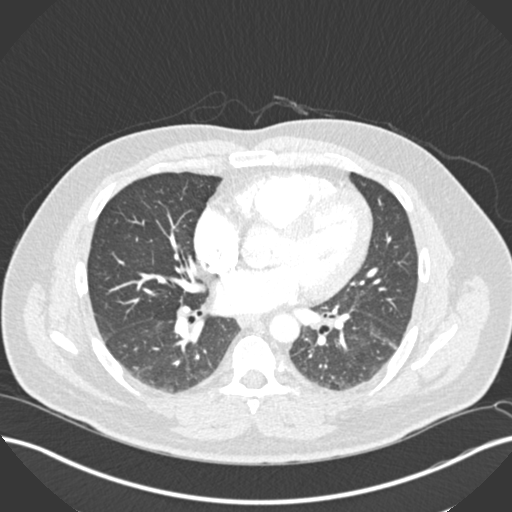
[im 168/298  soft-tissue]
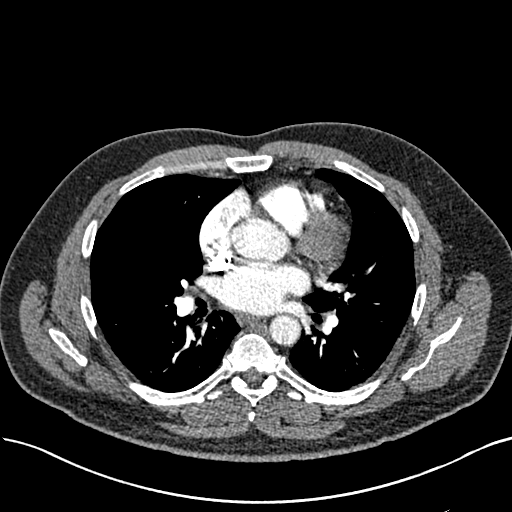
[im 181/298  lung]
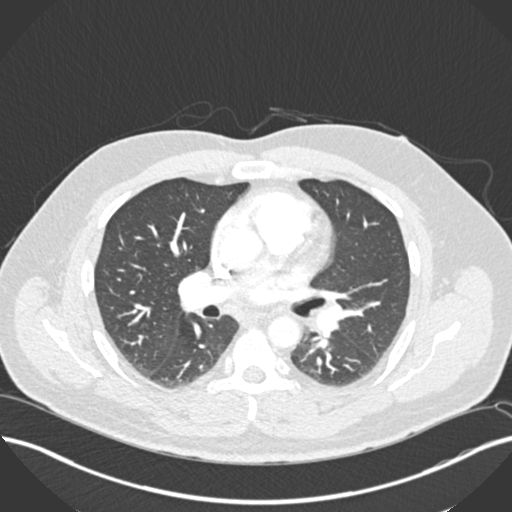
[im 194/298  soft-tissue]
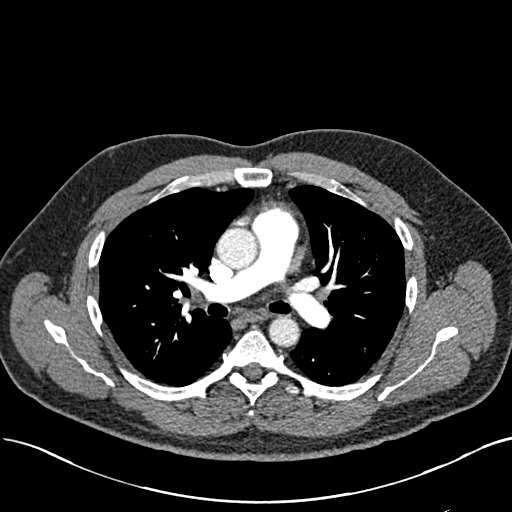
[im 220/298  lung]
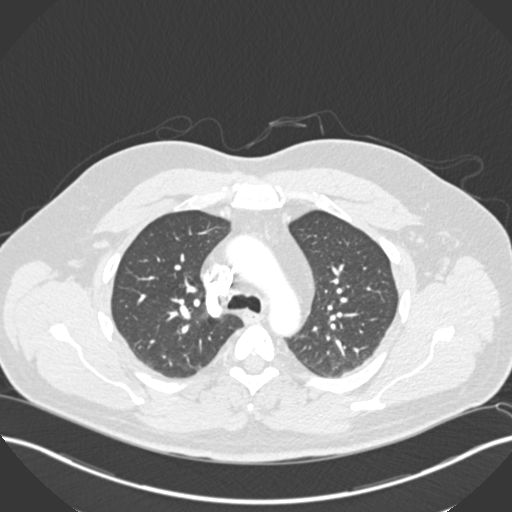
[im 233/298  soft-tissue]
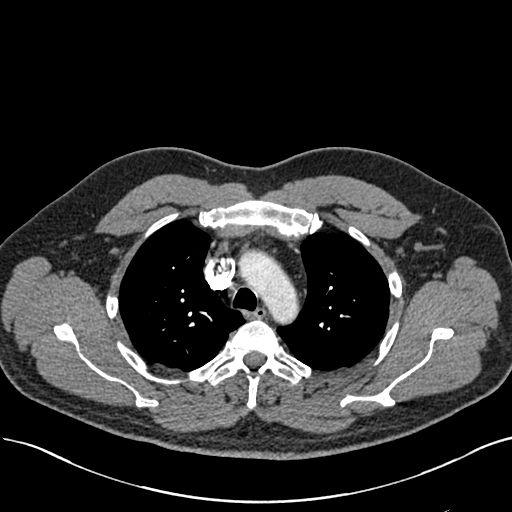
[im 246/298  lung]
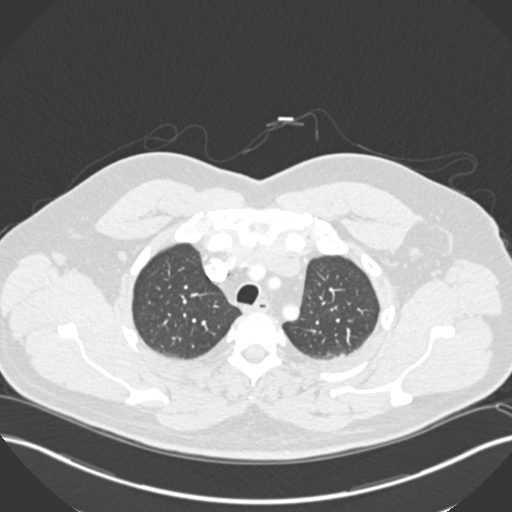
[im 272/298  soft-tissue]
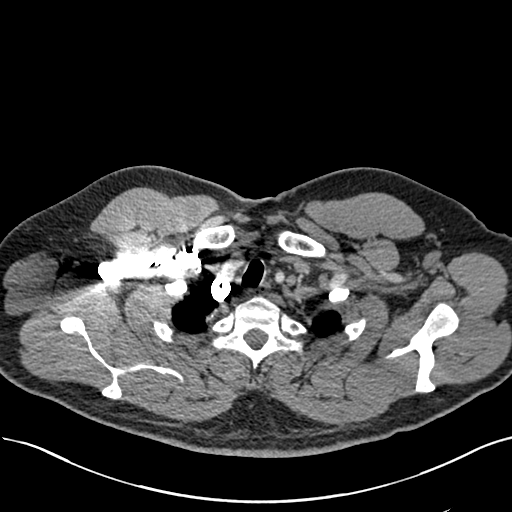
[im 285/298  lung]
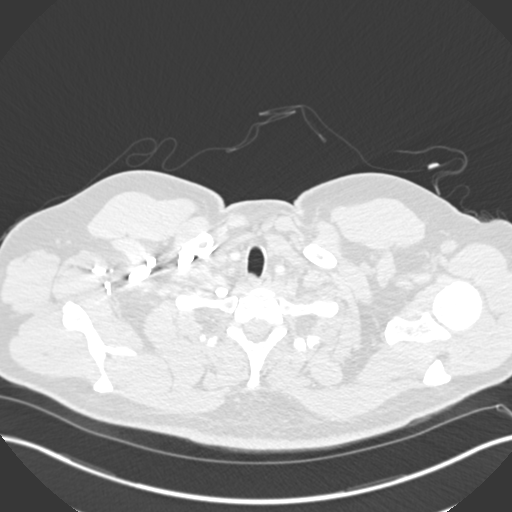

[Series 7: coronal mpr · coronal · 0.62mm/px · 2 of 99 slices shown]
[im 33/99  soft-tissue]
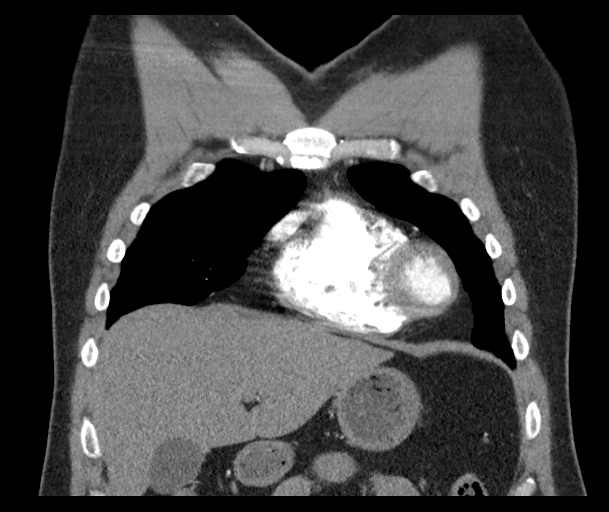
[im 66/99  soft-tissue]
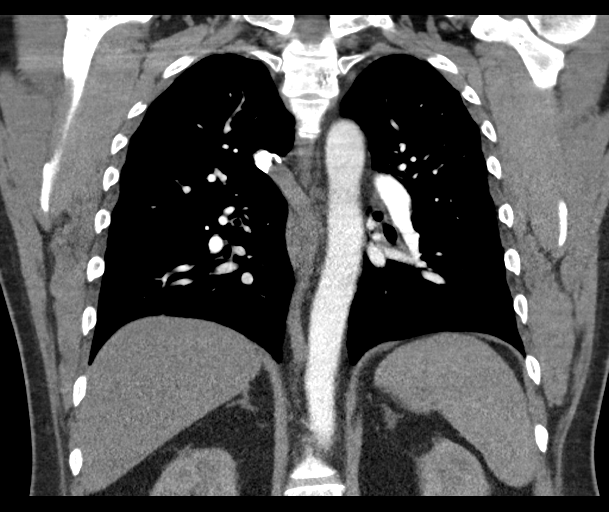

[19 of 46 positions shown; findings below may reference images not displayed]

FINDINGS: Cardiovascular: Conventional branch pattern of the great vessels
without significant stenosis. Nonaneurysmal thoracic aorta without
evidence of dissection. Satisfactory opacification of the pulmonary
arteries to the segmental level without acute pulmonary embolus.
Heart size is top-normal. Minimal coronary arteriosclerosis along
the distal LAD.

Mediastinum/Nodes: No enlarged mediastinal, hilar, or axillary lymph
nodes. Thyroid gland, trachea, and esophagus demonstrate no
significant findings.

Lungs/Pleura: Bibasilar dependent atelectasis. No effusion or
pneumothorax. Acute pulmonary consolidation. No dominant mass.

Upper Abdomen: No acute abnormality.

Musculoskeletal: No chest wall abnormality. No acute or significant
osseous findings.

Review of the MIP images confirms the above findings.
IMPRESSION: 1. No acute pulmonary embolus, aortic aneurysm or dissection.
2. Minimal coronary arteriosclerosis along the LAD.

## 2019-10-03 NOTE — Progress Notes (Addendum)
Office Visit Note  Patient: Michael Peck             Date of Birth: May 24, 1965           MRN: [id number]             PCP: Jerl Mina, MD Referring: Jerl Mina, MD Visit Date: 10/17/2019 Occupation: @GUAROCC @  Subjective:  Medication monitoring   History of Present Illness: [name] is a 54 y.o. male with history of psoriatic arthritis.  Patient is on Cosentyx 300 mg subcutaneous injections every 28 days, methotrexate 0.8 mL likeness injections once weekly, and folic acid 2 mg by mouth daily.  He is due for his Cosentyx and methotrexate injections but needs refills of both.  He states he experiences some increased discomfort and stiffness couple days prior to his Cosentyx injections.  He denies any joint swelling at this time.  He has some discomfort in the left shoulder especially when laying on his left side at night.  He has small patches of psoriasis on the extensor surface of both elbows but has not been using clobetasol recently.  He has persistent plantar fasciitis bilaterally but denies any Achilles tendinitis.  He denies any SI joint pain. He has not had any recent infections.  He has received both COVID-19 vaccinations and is planning on receiving the third dose.   Activities of Daily Living:  Patient reports morning stiffness for a few minutes.   Patient Reports nocturnal pain.  Difficulty dressing/grooming: Denies Difficulty climbing stairs: Denies Difficulty getting out of chair: Denies Difficulty using hands for taps, buttons, cutlery, and/or writing: Denies  Review of Systems  Constitutional: Negative for fatigue.  HENT: Negative for mouth sores, mouth dryness and nose dryness.   Eyes: Positive for dryness.  Respiratory: Negative for shortness of breath and difficulty breathing.   Cardiovascular: Negative for chest pain and palpitations.  Gastrointestinal: Negative for blood in stool, constipation and diarrhea.  Endocrine: Negative for increased urination.    Genitourinary: Negative for difficulty urinating.  Musculoskeletal: Positive for arthralgias, joint pain, joint swelling and morning stiffness. Negative for myalgias, muscle tenderness and myalgias.  Skin: Negative for color change, rash and redness.  Allergic/Immunologic: Negative for susceptible to infections.  Neurological: Negative for dizziness, numbness, headaches, memory loss and weakness.  Hematological: Negative for bruising/bleeding tendency.  Psychiatric/Behavioral: Negative for confusion.    PMFS History:  Patient Active Problem List   Diagnosis Date Noted  . High risk medication use 12/10/2015  . Low back pain 12/10/2015  . Obesity 12/10/2015  . Psoriasis 12/09/2015  . Psoriatic arthritis (HCC) 12/09/2015    Past Medical History:  Diagnosis Date  . Arthritis   . Hypertension   . Psoriasis 12/09/2015  . Psoriatic arthritis (HCC) 12/09/2015   Failed Enbrel    Family History  Problem Relation Age of Onset  . Epilepsy Mother   . Cancer Father        prostate   . Healthy Daughter    Past Surgical History:  Procedure Laterality Date  . ADENOIDECTOMY     Social History   Social History Narrative  . Not on file   Immunization History  Administered Date(s) Administered  . Influenza Inj Mdck Quad Pf 11/07/2016, 11/03/2017  . PFIZER SARS-COV-2 Vaccination 05/17/2019, 06/12/2019     Objective: Vital Signs: BP (!) 142/95 (BP Location: Left Arm, Patient Position: Sitting, Cuff Size: Normal)   Pulse (!) 56   Resp 16   Ht 5\' 10"  (1.778 m)  Wt 225 lb 3.2 oz (102.2 kg)   BMI 32.31 kg/m    Physical Exam Vitals and nursing note reviewed.  Constitutional:      Appearance: He is well-developed.  HENT:     Head: Normocephalic and atraumatic.  Eyes:     Conjunctiva/sclera: Conjunctivae normal.     Pupils: Pupils are equal, round, and reactive to light.  Pulmonary:     Effort: Pulmonary effort is normal.  Abdominal:     Palpations: Abdomen is soft.   Musculoskeletal:     Cervical back: Normal range of motion and neck supple.  Skin:    General: Skin is warm and dry.     Capillary Refill: Capillary refill takes less than 2 seconds.     Comments: Small patches of psoriasis on extensor surface of both elbows. Fingernail pitting noted.  Toenail dystrophy noted.  Neurological:     Mental Status: He is alert and oriented to person, place, and time.  Psychiatric:        Behavior: Behavior normal.      Musculoskeletal Exam: C-spine has slightly limited range of motion with lateral rotation.  Thoracic and lumbar spine have good range of motion.  Shoulder joints have good range of motion with some discomfort in the left shoulder.  Elbow joints have good range of motion with no tenderness or inflammation.  Wrist joints have good range of motion with no tenderness or inflammation.  No tenderness or synovitis of MCP joints.  He has PIP and DIP thickening consistent with osteoarthritis and psoriatic arthritis overlap.  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 Hip joints have good range of motion with no discomfort.  Knee joints have good range of motion with no warmth or effusion.  Ankle joints have good range of motion with no tenderness or inflammation.  No tenderness of MTP joints.  He has PIP and DIP thickening consistent with osteoarthritis and psoriatic arthritis overlap in both feet.  Hammertoes noted.  Tenderness along the plantar fascial bursa bilaterally.  No Achilles tendinitis.  CDAI Exam: CDAI Score: -- Patient Global: --; Provider Global: -- Swollen: --; Tender: -- Joint Exam 10/17/2019   No joint exam has been documented for this visit   There is currently no information documented on the homunculus. Go to the Rheumatology activity and complete the homunculus joint exam.  Investigation: No additional findings.  Imaging: No results found.  Recent Labs: Lab Results  Component Value Date   WBC 5.9 07/23/2019   HGB 13.6 07/23/2019    PLT 221 07/23/2019   NA 140 07/23/2019   K 4.4 07/23/2019   CL 107 07/23/2019   CO2 26 07/23/2019   GLUCOSE 108 (H) 07/23/2019   BUN 15 07/23/2019   CREATININE 0.97 07/23/2019   BILITOT 0.4 07/23/2019   ALKPHOS 84 02/12/2019   AST 14 07/23/2019   ALT 14 07/23/2019   PROT 6.5 07/23/2019   ALBUMIN 4.2 02/12/2019   CALCIUM 9.1 07/23/2019   GFRAA 103 07/23/2019   QFTBGOLDPLUS Negative 01/08/2019    Speciality Comments: No specialty comments available.  Procedures:  No procedures performed Allergies: Patient has no known allergies.   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  He has not had any recent psoriatic arthritis flares.  He has persistent plantar fasciitis bilaterally and was encouraged to perform stretching exercises on a daily basis.  He is  not experiencing any Achilles tendinitis or SI joint pain at this time.  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  PIP and DIP thickening consistent with osteoarthritis and psoriatic arthritis overlap in both feet noted on exam.  He has ongoing fingernail pitting and toenail dystrophy. He is clinically doing well on Cosentyx 300 mg subcutaneous injections every 28 days, methotrexate 0.8 mL subcutaneous injections once weekly, and folic acid 2 mg by mouth daily.  He has been having difficulty obtaining vial and syringes of methotrexate through his pharmacy and has difficulty at times preparing and administering these injections if he is having pain and inflammation in his hands.  He would like to remain on injectable methotrexate due to the increased efficacy compared to oral methotrexate.  We will apply for Rasuvo 20 mg subcutaneous injections once weekly.  He was given a sample of Rasuvo today in the office.   He was advised to notify us if he develops increased joint pain or joint swelling.  He will follow-up in the office in 5 months.  Association of heart disease with psoriatic arthritis was discussed. Need to monitor blood pressure, cholesterol, and to exercise 30-60 minutes on daily basis was discussed.   Psoriasis: He has small patches of psoriasis on the extensor surface of both elbows.  Fingernail pitting and toenail dystrophy noted.  He has a prescription for clobetasol cream which she can apply topically twice daily as needed.  He does not need a refill at this time.  High risk medication use - Cosentyx 300 mg sq q month, Methotrexate 0.8 ml sq q wk, Folic acid 2 mg po qd. (Failed Enbrel & Humira).  CBC and CMP were drawn on 07/23/2019.  He is due to update lab work today.  Orders for CBC and CMP were released.  He will be due to update labs in December and every 3 months to monitor for drug toxicity.  TB gold was negative on 01/08/2019 and will continue to be monitored yearly.  Future order for TB gold was placed today.- Plan: CBC with Differential/Platelet, COMPLETE METABOLIC PANEL WITH GFR, QuantiFERON-TB Gold Plus He has received both COVID-19 vaccinations and is planning on receiving the third dose.  He was advised to avoid taking Tylenol and NSAIDs 24 hours prior to the third dose.  He was also advised to hold methotrexate 1 week after receiving the third dose.  He was advised to notify us or his PCP if he develops a COVID-19 infection in order to receive the antibody infusion.  He was also encouraged to continue to wear a mask and social distance.  He voiced understanding and all questions were addressed.  Screening for tuberculosis -TB gold negative on 01/08/2019.  Future order for TB gold was placed today.  Plan: QuantiFERON-TB Gold Plus  Sacroiliitis [facility]): He is not experiencing any SI joint pain at this time.  Other medical conditions are listed as follows:   History of  hypertension  History of obesity    Orders: Orders Placed This Encounter  Procedures  . CBC with Differential/Platelet  . COMPLETE METABOLIC PANEL WITH GFR  . QuantiFERON-TB Gold Plus   No orders of the defined types were placed in this encounter.   Follow-Up Instructions: Return in about 5 months (around 03/18/2020) for Psoriatic arthritis.   Gearldine Bienenstock, PA-C  Note - This record has been created using Dragon software.  Chart  creation errors have been sought, but may not always  have been located. Such creation errors do not reflect on  the standard of medical care.

## 2019-10-17 ENCOUNTER — Telehealth: Payer: Self-pay | Admitting: *Deleted

## 2019-10-17 ENCOUNTER — Encounter: Payer: Self-pay | Admitting: Physician Assistant

## 2019-10-17 ENCOUNTER — Ambulatory Visit (INDEPENDENT_AMBULATORY_CARE_PROVIDER_SITE_OTHER): Payer: BC Managed Care – PPO | Admitting: Physician Assistant

## 2019-10-17 ENCOUNTER — Other Ambulatory Visit: Payer: Self-pay

## 2019-10-17 VITALS — BP 142/95 | HR 56 | Resp 16 | Ht 70.0 in | Wt 225.2 lb

## 2019-10-17 DIAGNOSIS — Z8679 Personal history of other diseases of the circulatory system: Secondary | ICD-10-CM

## 2019-10-17 DIAGNOSIS — Z79899 Other long term (current) drug therapy: Secondary | ICD-10-CM | POA: Diagnosis not present

## 2019-10-17 DIAGNOSIS — M461 Sacroiliitis, not elsewhere classified: Secondary | ICD-10-CM | POA: Diagnosis not present

## 2019-10-17 DIAGNOSIS — L4050 Arthropathic psoriasis, unspecified: Secondary | ICD-10-CM

## 2019-10-17 DIAGNOSIS — L409 Psoriasis, unspecified: Secondary | ICD-10-CM

## 2019-10-17 DIAGNOSIS — Z8639 Personal history of other endocrine, nutritional and metabolic disease: Secondary | ICD-10-CM

## 2019-10-17 DIAGNOSIS — Z111 Encounter for screening for respiratory tuberculosis: Secondary | ICD-10-CM

## 2019-10-17 LAB — CBC WITH DIFFERENTIAL/PLATELET
Absolute Monocytes: 697 cells/uL (ref 200–950)
Basophils Absolute: 20 cells/uL (ref 0–200)
Basophils Relative: 0.3 %
Eosinophils Absolute: 67 cells/uL (ref 15–500)
Eosinophils Relative: 1 %
HCT: 44.9 % (ref 38.5–50.0)
Hemoglobin: 15.3 g/dL (ref 13.2–17.1)
Lymphs Abs: 1876 cells/uL (ref 850–3900)
MCH: 31.1 pg (ref 27.0–33.0)
MCHC: 34.1 g/dL (ref 32.0–36.0)
MCV: 91.3 fL (ref 80.0–100.0)
MPV: 11.5 fL (ref 7.5–12.5)
Monocytes Relative: 10.4 %
Neutro Abs: 4040 cells/uL (ref 1500–7800)
Neutrophils Relative %: 60.3 %
Platelets: 235 10*3/uL (ref 140–400)
RBC: 4.92 10*6/uL (ref 4.20–5.80)
RDW: 13.1 % (ref 11.0–15.0)
Total Lymphocyte: 28 %
WBC: 6.7 10*3/uL (ref 3.8–10.8)

## 2019-10-17 LAB — COMPLETE METABOLIC PANEL WITH GFR
AG Ratio: 1.6 (calc) (ref 1.0–2.5)
ALT: 15 U/L (ref 9–46)
AST: 15 U/L (ref 10–35)
Albumin: 4.3 g/dL (ref 3.6–5.1)
Alkaline phosphatase (APISO): 61 U/L (ref 35–144)
BUN: 14 mg/dL (ref 7–25)
CO2: 28 mmol/L (ref 20–32)
Calcium: 9.3 mg/dL (ref 8.6–10.3)
Chloride: 103 mmol/L (ref 98–110)
Creat: 1.14 mg/dL (ref 0.70–1.33)
GFR, Est African American: 85 mL/min/{1.73_m2} (ref >=60)
GFR, Est Non African American: 73 mL/min/{1.73_m2} (ref >=60)
Globulin: 2.7 g/dL (calc) (ref 1.9–3.7)
Glucose, Bld: 95 mg/dL (ref 65–99)
Potassium: 4.5 mmol/L (ref 3.5–5.3)
Sodium: 139 mmol/L (ref 135–146)
Total Bilirubin: 0.6 mg/dL (ref 0.2–1.2)
Total Protein: 7 g/dL (ref 6.1–8.1)

## 2019-10-17 MED ORDER — RASUVO 20 MG/0.4ML ~~LOC~~ SOAJ: 20.0000 mg | SUBCUTANEOUS | 0 refills | Status: DC

## 2019-10-17 NOTE — Telephone Encounter (Signed)
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  I will addend the office visit note.  Thank you!

## 2019-10-17 NOTE — Telephone Encounter (Signed)
Per Sherron Ales, PA-C please apply for Methotrexate pen. Otrexup or Rasuvo, whichever is approved by insurance.

## 2019-10-17 NOTE — Progress Notes (Signed)
Medication Samples have been provided to the patient.  Drug name: Rasuvo     Strength: 20 mg       Qty: 1 LOT: [id number]  Exp.Date: 05/2020  Dosing instructions: Inject one pen into skin once weekly.   The patient has been instructed regarding the correct time, dose, and frequency of taking this medication, including desired effects and most common side effects.   Michael Peck 9:10 AM 10/17/2019

## 2019-10-17 NOTE — Patient Instructions (Signed)
Standing Labs We placed an order today for your standing lab work.   Please have your standing labs drawn in December and every 3 months   If possible, please have your labs drawn 2 weeks prior to your appointment so that the provider can discuss your results at your appointment.  We have open lab daily Monday through Thursday from 8:30-12:30 PM and 1:30-4:30 PM and Friday from 8:30-12:30 PM and 1:30-4:00 PM at the office of Dr. Shaili Deveshwar, [facility].   Please be advised, patients with office appointments requiring lab work will take precedents over walk-in lab work.  If possible, please come for your lab work on Monday and Friday afternoons, as you may experience shorter wait times. The office is located at [address], [address], [place], [place] [address] No appointment is necessary.   Labs are drawn by [facility]. Please bring your co-pay at the time of your lab draw.  You may receive a bill from [facility] for your lab work.  If you wish to have your labs drawn at another location, please call the office 24 hours in advance to send orders.  If you have any questions regarding directions or hours of operation,  please call [phone].   As a reminder, please drink plenty of water prior to coming for your lab work. Thanks!   COVID-19 vaccine recommendations:   COVID-19 vaccine is recommended for everyone (unless you are allergic to a vaccine component), even if you are on a medication that suppresses your immune system.   If you are on Methotrexate, Cellcept (mycophenolate), Rinvoq, Xeljanz, and Olumiant- hold the medication for 1 week after each vaccine. Hold Methotrexate for 2 weeks after the single dose COVID-19 vaccine.   If you are on Orencia subcutaneous injection - hold medication one week prior to and one week after the first COVID-19 vaccine dose (only).   If you are on Orencia IV infusions- time vaccination administration so that the first COVID-19  vaccination will occur four weeks after the infusion and postpone the subsequent infusion by one week.   If you are on Cyclophosphamide or Rituxan infusions please contact your doctor prior to receiving the COVID-19 vaccine.   Do not take Tylenol or any anti-inflammatory medications (NSAIDs) 24 hours prior to the COVID-19 vaccination.   There is no direct evidence about the efficacy of the COVID-19 vaccine in individuals who are on medications that suppress the immune system.   Even if you are fully vaccinated, and you are on any medications that suppress your immune system, please continue to wear a mask, maintain at least six feet social distance and practice hand hygiene.   If you develop a COVID-19 infection, please contact your PCP or our office to determine if you need antibody infusion.  The booster vaccine is now available for immunocompromised patients. It is advised that if you had Pfizer vaccine you should get Pfizer booster.  If you had a Moderna vaccine then you should get a Moderna booster. Johnson and Johnson does not have a booster vaccine at this time.  Please see the following web sites for updated information.   [email or web address]://[email or web address]/[email or web address]-19-Vaccination-Patient-Resources.[email or web address]  [email or web address]://[email or web address]/About-Us/Newsroom/Press-Releases/ID/1159    

## 2019-10-17 NOTE — Addendum Note (Signed)
Addended by: Gearldine Bienenstock on: 10/17/2019 04:38 PM   Modules accepted: Orders

## 2019-10-17 NOTE — Telephone Encounter (Signed)
Thank you :)

## 2019-10-17 NOTE — Telephone Encounter (Signed)
Attempted to contact the patient and left message for patient to call the office.  

## 2019-10-17 NOTE — Addendum Note (Signed)
Addended by: Henriette Combs on: 10/17/2019 03:14 PM   Modules accepted: Orders

## 2019-10-17 NOTE — Telephone Encounter (Signed)
Received notification from [facility] regarding a prior authorization for RASUVO. Authorization has been APPROVED from 10/17/19 to 10/16/20.   Authorization # [id number]  Per plan, patient must fill through [facility]. Patient has Nurse, learning disability and use a copay card.  2:07 PM Dorthula Nettles, CPhT

## 2019-10-17 NOTE — Telephone Encounter (Signed)
Michael Peck is preferred.  Patient's plan requires clinical documentation that patient is unable to prepare and administer generic injectable methotrexate for Rasuvo PA.   Chart note states that patient had difficulty with access for vial and syringe, PA likely will denied please advise.

## 2019-10-17 NOTE — Telephone Encounter (Signed)
Submitted a Prior Authorization request to [facility] for RASUVO via Cover My Meds. Will update once we receive a response.   Key: [id number] - PA Case ID: [id number]

## 2019-10-18 NOTE — Progress Notes (Signed)
CBC and CMP WNL

## 2019-12-01 ENCOUNTER — Other Ambulatory Visit: Payer: Self-pay | Admitting: Rheumatology

## 2020-01-22 ENCOUNTER — Other Ambulatory Visit: Payer: Self-pay | Admitting: Rheumatology

## 2020-01-22 ENCOUNTER — Other Ambulatory Visit: Payer: Self-pay | Admitting: Physician Assistant

## 2020-01-22 IMAGING — CT CT ABD-PELV W/ CM
2 of 5 series · 16 of 46 positions shown, 18 images · IV contrast (APPLIED)
Comparison: None.

CLINICAL DATA: Left lower quadrant pain starting this morning.

EXAM:
CT ABDOMEN AND PELVIS WITH CONTRAST
TECHNIQUE: Multidetector CT imaging of the abdomen and pelvis was performed
using the standard protocol following bolus administration of
intravenous contrast.
CONTRAST:  100mL AAC7WY-RTT IOPAMIDOL (AAC7WY-RTT) INJECTION 61%

[Series 2: routine abd/pel with · axial · 0.74mm/px · z∈[-642,-177]mm · 13 of 105 slices shown, 15 images]
[im 6/105  soft-tissue]
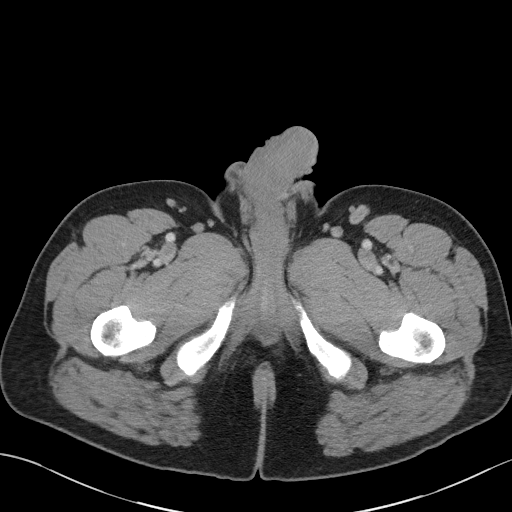
[im 6/105  bone]
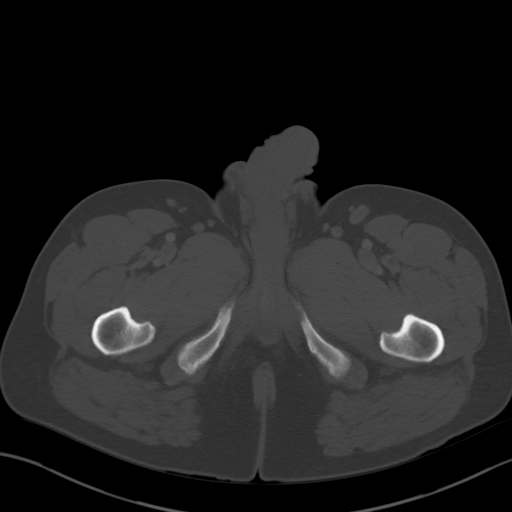
[im 17/105  soft-tissue]
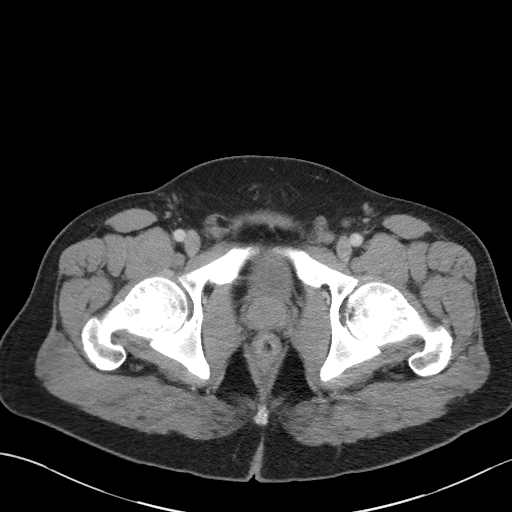
[im 22/105  soft-tissue]
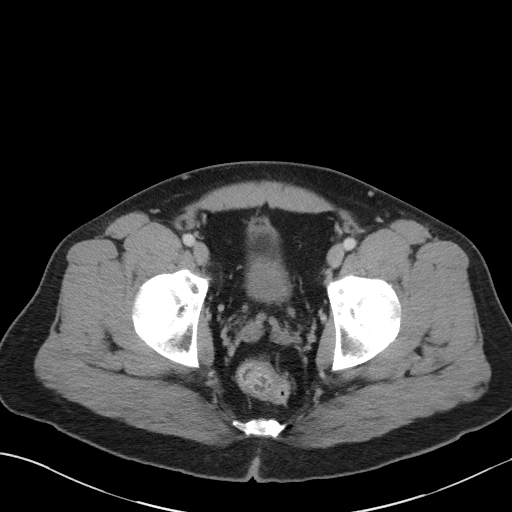
[im 28/105  soft-tissue]
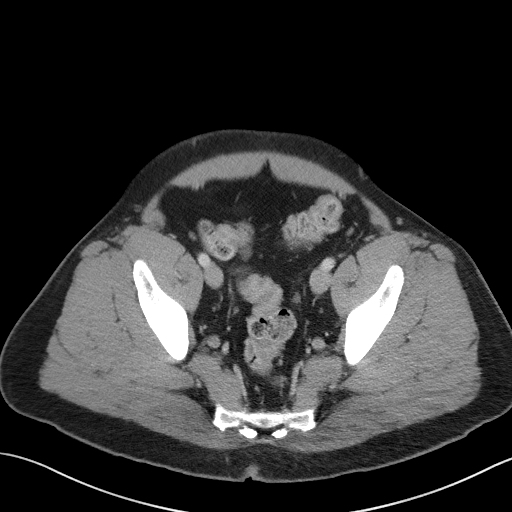
[im 39/105  soft-tissue]
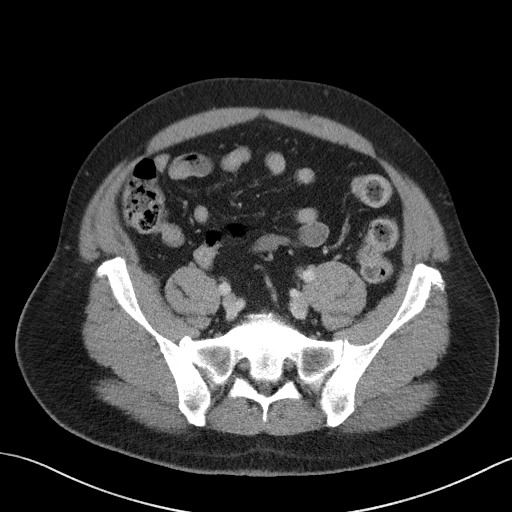
[im 44/105  soft-tissue]
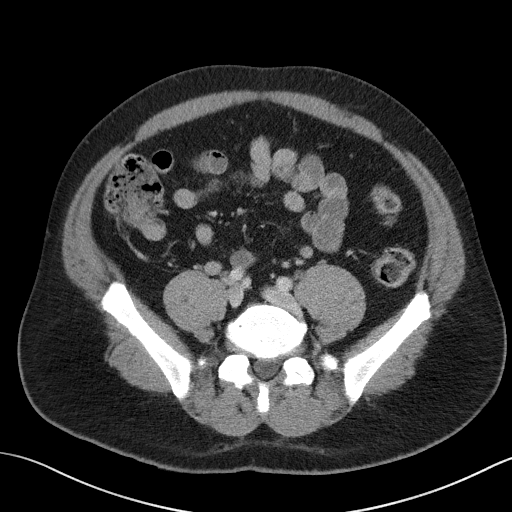
[im 55/105  soft-tissue]
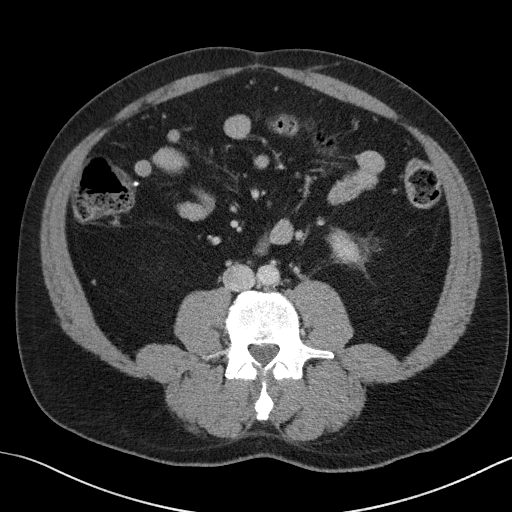
[im 61/105  soft-tissue]
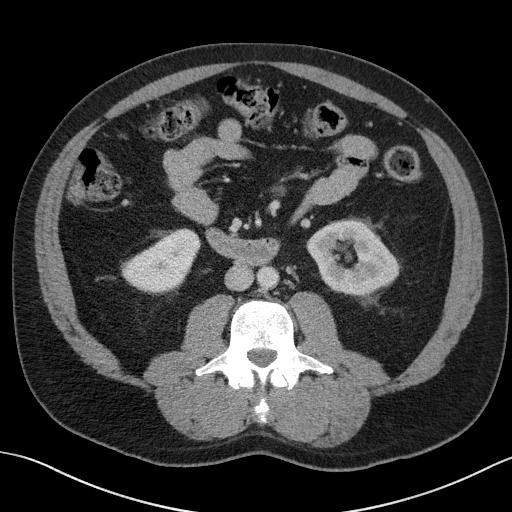
[im 66/105  soft-tissue]
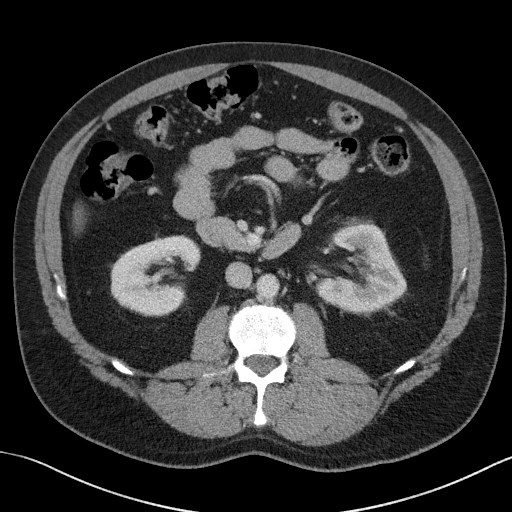
[im 66/105  bone]
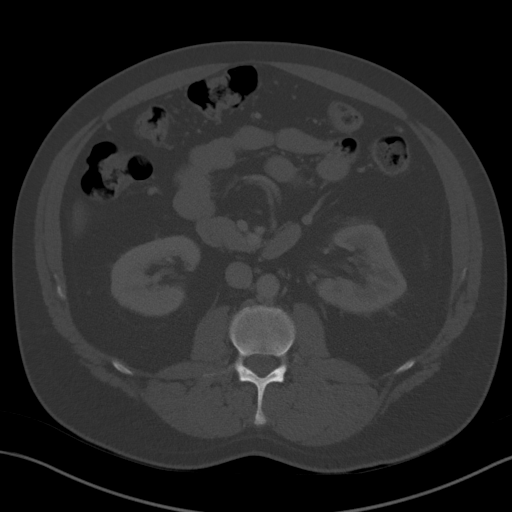
[im 77/105  soft-tissue]
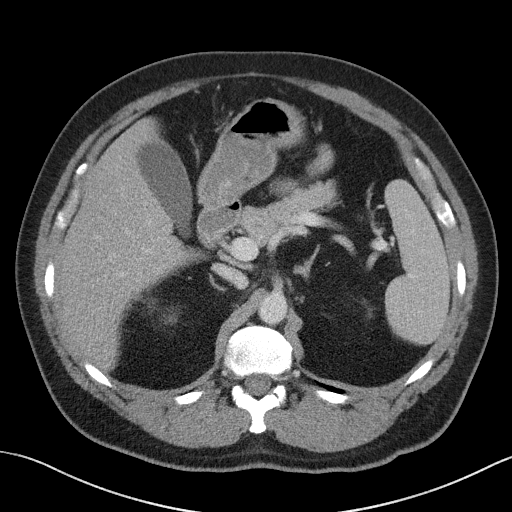
[im 83/105  soft-tissue]
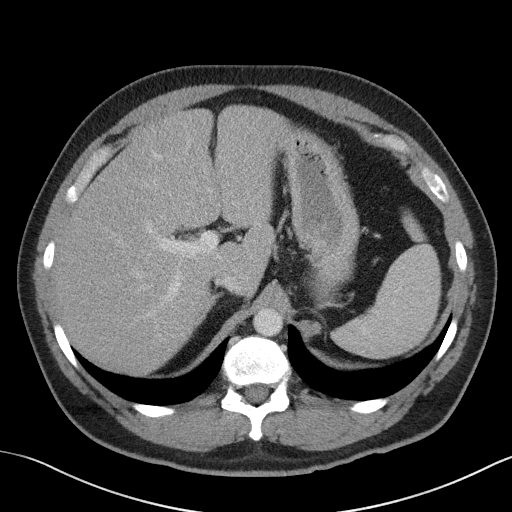
[im 88/105  soft-tissue]
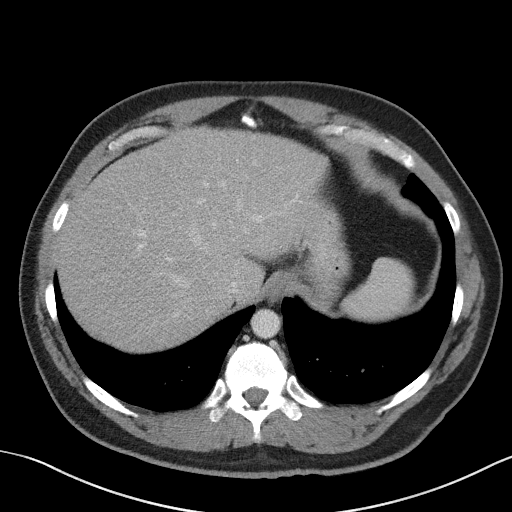
[im 99/105  soft-tissue]
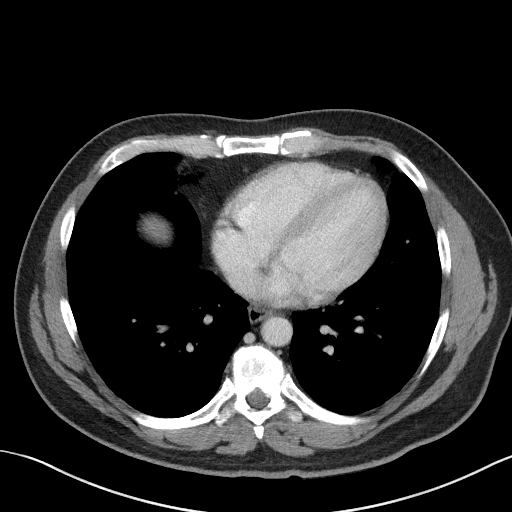

[Series 5: coronal st · coronal · 0.78mm/px · 3 of 111 slices shown]
[im 37/111  soft-tissue]
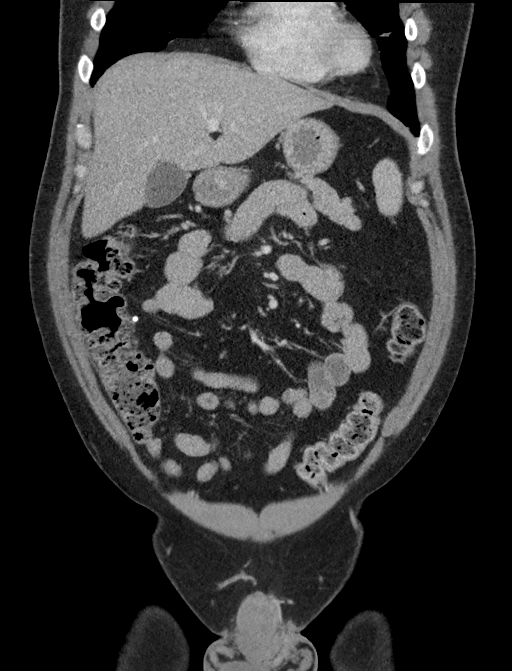
[im 49/111  soft-tissue]
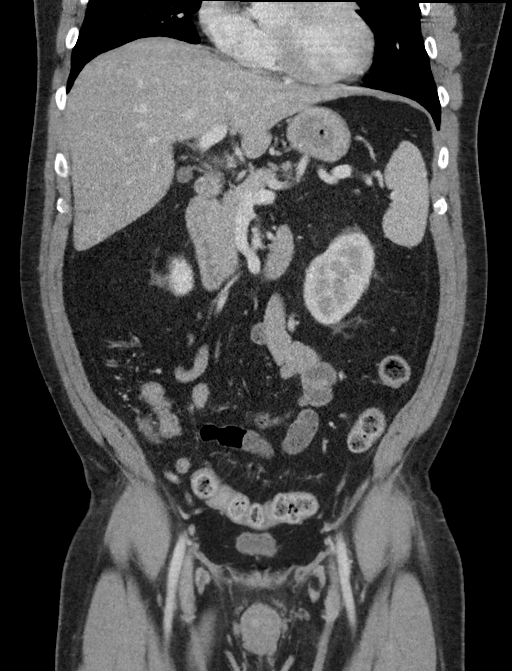
[im 62/111  soft-tissue]
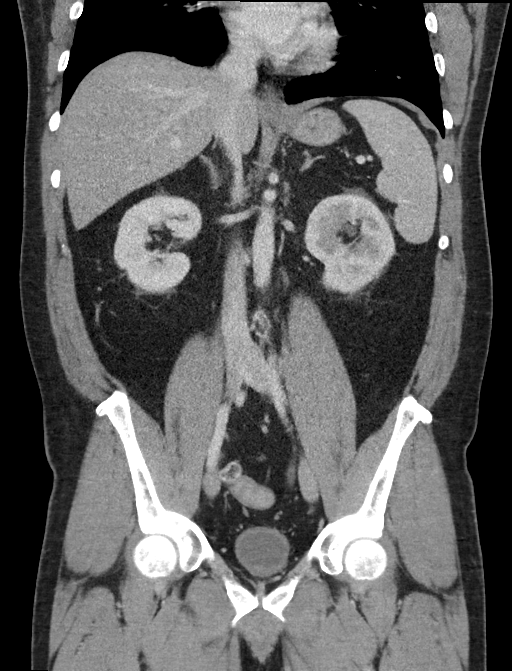

[16 of 46 positions shown; findings below may reference images not displayed]

FINDINGS: Lower chest: No acute abnormality.

Hepatobiliary: No focal liver abnormality is seen. No gallstones,
gallbladder wall thickening, or biliary dilatation.

Pancreas: Unremarkable. No pancreatic ductal dilatation or
surrounding inflammatory changes.

Spleen: Normal in size without focal abnormality.

Adrenals/Urinary Tract: Adrenal glands appear normal. 2 mm stone
within the distal LEFT ureter, just proximal to the LEFT UVJ,
causing mild hydronephrosis and perinephric edema. Additional 3 mm
LEFT renal stone.

RIGHT kidney is unremarkable without stone or hydronephrosis.
Bladder is decompressed.

Stomach/Bowel: No dilated large or small bowel loops. No evidence of
bowel wall inflammation. Appendix is normal. Stomach is
unremarkable, partially decompressed.

Vascular/Lymphatic: No significant vascular findings are present. No
enlarged abdominal or pelvic lymph nodes.

Reproductive: Prostate is unremarkable.

Other: No abscess collection.  No free intraperitoneal air.

Musculoskeletal: No acute or suspicious osseous finding. Mild
degenerative spondylosis within the lower lumbar spine.
IMPRESSION: 1. 2 mm stone within the distal LEFT ureter, located approximately 1
cm proximal to the LEFT UVJ, causing mild LEFT-sided hydronephrosis
and perinephric edema.
2. 3 mm LEFT renal stone.

## 2020-01-22 NOTE — Telephone Encounter (Signed)
Last Visit: 10/17/2019 Next Visit: 03/19/2020 Labs: 10/17/2019 CBC and CMP WNL.  Tb Gold: 01/08/2019 Neg   Current Dose per office note 10/17/2019:Cosentyx 300 mg subcutaneous injections every 28 days DX: Psoriatic arthritis  Patient advised he is due for labs. Patient will try to come Friday to update.   Okay to refill Cosentyx?

## 2020-01-22 NOTE — Telephone Encounter (Signed)
Last Visit: 10/17/2019 Next Visit: 03/19/2020 Labs: 10/17/2019 CBC and CMP WNL.   Current Dose per office note 10/17/2019: Rasuvo 20 mg subcutaneous injections once weekly DX: Psoriatic arthritis  Patient advised he is due for labs. Patient will try to come Friday to update.   Okay to refill MTX?

## 2020-01-24 ENCOUNTER — Other Ambulatory Visit: Payer: Self-pay | Admitting: *Deleted

## 2020-01-24 ENCOUNTER — Other Ambulatory Visit: Payer: Self-pay

## 2020-01-24 DIAGNOSIS — Z111 Encounter for screening for respiratory tuberculosis: Secondary | ICD-10-CM

## 2020-01-24 DIAGNOSIS — Z79899 Other long term (current) drug therapy: Secondary | ICD-10-CM

## 2020-01-24 DIAGNOSIS — Z9225 Personal history of immunosupression therapy: Secondary | ICD-10-CM

## 2020-01-26 LAB — COMPLETE METABOLIC PANEL WITH GFR
AG Ratio: 1.6 (calc) (ref 1.0–2.5)
ALT: 13 U/L (ref 9–46)
AST: 12 U/L (ref 10–35)
Albumin: 4.2 g/dL (ref 3.6–5.1)
Alkaline phosphatase (APISO): 63 U/L (ref 35–144)
BUN: 17 mg/dL (ref 7–25)
CO2: 28 mmol/L (ref 20–32)
Calcium: 9.4 mg/dL (ref 8.6–10.3)
Chloride: 107 mmol/L (ref 98–110)
Creat: 1.08 mg/dL (ref 0.70–1.33)
GFR, Est African American: 90 mL/min/{1.73_m2} (ref >=60)
GFR, Est Non African American: 78 mL/min/{1.73_m2} (ref >=60)
Globulin: 2.6 g/dL (calc) (ref 1.9–3.7)
Glucose, Bld: 104 mg/dL — ABNORMAL HIGH (ref 65–99)
Potassium: 4.5 mmol/L (ref 3.5–5.3)
Sodium: 141 mmol/L (ref 135–146)
Total Bilirubin: 0.6 mg/dL (ref 0.2–1.2)
Total Protein: 6.8 g/dL (ref 6.1–8.1)

## 2020-01-26 LAB — CBC WITH DIFFERENTIAL/PLATELET
Absolute Monocytes: 574 cells/uL (ref 200–950)
Basophils Absolute: 17 cells/uL (ref 0–200)
Basophils Relative: 0.3 %
Eosinophils Absolute: 58 cells/uL (ref 15–500)
Eosinophils Relative: 1 %
HCT: 42.1 % (ref 38.5–50.0)
Hemoglobin: 14.6 g/dL (ref 13.2–17.1)
Lymphs Abs: 1612 cells/uL (ref 850–3900)
MCH: 31.9 pg (ref 27.0–33.0)
MCHC: 34.7 g/dL (ref 32.0–36.0)
MCV: 91.9 fL (ref 80.0–100.0)
MPV: 11.6 fL (ref 7.5–12.5)
Monocytes Relative: 9.9 %
Neutro Abs: 3538 cells/uL (ref 1500–7800)
Neutrophils Relative %: 61 %
Platelets: 247 10*3/uL (ref 140–400)
RBC: 4.58 10*6/uL (ref 4.20–5.80)
RDW: 13.4 % (ref 11.0–15.0)
Total Lymphocyte: 27.8 %
WBC: 5.8 10*3/uL (ref 3.8–10.8)

## 2020-01-26 LAB — QUANTIFERON-TB GOLD PLUS
Mitogen-NIL: 8.06 IU/mL
NIL: 0.02 IU/mL
QuantiFERON-TB Gold Plus: NEGATIVE
TB1-NIL: 0.01 IU/mL
TB2-NIL: 0.01 IU/mL

## 2020-01-26 NOTE — Progress Notes (Signed)
CBC and CMP are normal.  TB Gold is negative.

## 2020-02-25 ENCOUNTER — Telehealth: Payer: Self-pay | Admitting: *Deleted

## 2020-02-25 DIAGNOSIS — U071 COVID-19: Secondary | ICD-10-CM

## 2020-02-25 NOTE — Telephone Encounter (Signed)
For post Covid exposure:  Date of Symptom Onset: 02/24/2020 Medical Conditions: Psoriatic arthritis, Psoriasis: Is patient receiving IV therapy for chronic conditions? No Has patient had an organ transplant or bone marrow transplant in the last 6 month? No    Patient advised to hold Cosentyx and Rasuvo for 3 weeks after Covid symptoms have resolved.   Referral for Infusion placed.

## 2020-03-05 NOTE — Progress Notes (Signed)
Office Visit Note  Patient: Michael Peck             Date of Birth: 1965-05-21           MRN: [id number]             PCP: Jerl Mina, MD Referring: Jerl Mina, MD Visit Date: 03/19/2020 Occupation: @GUAROCC @  Subjective:  Medication monitoring   History of Present Illness: Michael Peck is a 55 y.o. male with history of psoriatic arthritis.  He is prescribed cosentyx 300 mg sq injections every 28 days, rasuvo 20 mg sq injections once weekly, and folic acid 2 mg po daily. He has been holding cosentyx and rasuvo for the past 5 weeks due to being diagnosed with covid-19 in December 2021.  He states his symptoms were mild and self resolving.  He did not receive the monoclonal antibody infusion.  He plans on restarting cosentyx and rasuvo tomorrow.  He continues to have morning stiffness for about 5-10 minutes daily.  He denies any increased joint pain or joint swelling since being off of cosentyx and rasuvo.  He denies any achilles tendonitis or plantar fasciitis.  He denies any SI joint discomfort. He has a few small patches of psoriasis on both elbows but he has not been using any topical agents.    Activities of Daily Living:  Patient reports morning stiffness for 5-10 minutes.   Patient Denies nocturnal pain.  Difficulty dressing/grooming: Denies Difficulty climbing stairs: Denies Difficulty getting out of chair: Denies Difficulty using hands for taps, buttons, cutlery, and/or writing: Denies  Review of Systems  Constitutional: Negative for fatigue and night sweats.  HENT: Negative for mouth sores, mouth dryness and nose dryness.   Eyes: Positive for dryness. Negative for redness.  Respiratory: Negative for shortness of breath and difficulty breathing.   Cardiovascular: Negative for chest pain, palpitations, hypertension, irregular heartbeat and swelling in legs/feet.  Gastrointestinal: Negative for blood in stool, constipation and diarrhea.  Endocrine: Negative for increased  urination.  Genitourinary: Negative for painful urination.  Musculoskeletal: Positive for arthralgias, joint pain and morning stiffness. Negative for joint swelling, myalgias, muscle weakness, muscle tenderness and myalgias.  Skin: Negative for color change, rash, hair loss, nodules/bumps, skin tightness, ulcers and sensitivity to sunlight.  Allergic/Immunologic: Negative for susceptible to infections.  Neurological: Negative for dizziness, fainting, memory loss, night sweats and weakness.  Hematological: Negative for swollen glands.  Psychiatric/Behavioral: Negative for depressed mood and sleep disturbance. The patient is not nervous/anxious.     PMFS History:  Patient Active Problem List   Diagnosis Date Noted  . High risk medication use 12/10/2015  . Low back pain 12/10/2015  . Obesity 12/10/2015  . Psoriasis 12/09/2015  . Psoriatic arthritis (HCC) 12/09/2015    Past Medical History:  Diagnosis Date  . Arthritis   . Hypertension   . Psoriasis 12/09/2015  . Psoriatic arthritis (HCC) 12/09/2015   Failed Enbrel    Family History  Problem Relation Age of Onset  . Epilepsy Mother   . Cancer Father        prostate   . Healthy Daughter    Past Surgical History:  Procedure Laterality Date  . ADENOIDECTOMY     Social History   Social History Narrative  . Not on file   Immunization History  Administered Date(s) Administered  . Influenza Inj Mdck Quad Pf 11/07/2016, 11/03/2017  . Influenza, High Dose Seasonal PF 11/07/2016, 11/03/2017  . Influenza,inj,Quad PF,6+ Mos 11/03/2018  . Influenza-Unspecified 11/07/2016, 11/01/2019  .  PFIZER(Purple Top)SARS-COV-2 Vaccination 05/17/2019, 06/12/2019, 11/01/2019     Objective: Vital Signs: BP (!) 157/92 (BP Location: Left Arm, Patient Position: Sitting, Cuff Size: Normal)   Pulse 60   Resp 16   Ht 5\' 10"  (1.778 m)   Wt 233 lb 9.6 oz (106 kg)   BMI 33.52 kg/m    Physical Exam Vitals and nursing note reviewed.  Constitutional:       Appearance: He is well-developed and well-nourished.  HENT:     Head: Normocephalic and atraumatic.  Eyes:     Extraocular Movements: EOM normal.     Conjunctiva/sclera: Conjunctivae normal.     Pupils: Pupils are equal, round, and reactive to light.  Pulmonary:     Effort: Pulmonary effort is normal.  Abdominal:     Palpations: Abdomen is soft.  Musculoskeletal:     Cervical back: Normal range of motion and neck supple.  Skin:    General: Skin is warm and dry.     Capillary Refill: Capillary refill takes less than 2 seconds.  Neurological:     Mental Status: He is alert and oriented to person, place, and time.  Psychiatric:        Mood and Affect: Mood and affect normal.        Behavior: Behavior normal.      Musculoskeletal Exam: C-spine, thoracic spine, and lumbar spine good ROM.  No midline spinal tenderness.  No SI joint tenderness.  Shoulder joints, elbow joints, and wrist joints good ROM with no discomfort.  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   Hip joints good ROM with no discomfort.  Knee joints good ROM with no discomfort.  No warmth or effusion.  Tenderness, warmth, and inflammation of both ankle joints noted.   CDAI Exam: CDAI Score: -- Patient Global: --; Provider Global: -- Swollen: 2 ; Tender: 2  Joint Exam 03/19/2020      Right  Left  Ankle  Swollen Tender  Swollen Tender     Investigation: No additional findings.  Imaging: No results found.  Recent Labs: Lab Results  Component Value Date   WBC 5.8 01/24/2020   HGB 14.6 01/24/2020   PLT 247 01/24/2020   NA 141 01/24/2020   K 4.5 01/24/2020   CL 107 01/24/2020   CO2 28 01/24/2020   GLUCOSE 104 (H) 01/24/2020   BUN 17 01/24/2020   CREATININE 1.08 01/24/2020   BILITOT 0.6 01/24/2020   ALKPHOS 84 02/12/2019   AST 12 01/24/2020   ALT 13 01/24/2020   PROT 6.8 01/24/2020   ALBUMIN 4.2 02/12/2019   CALCIUM  9.4 01/24/2020   GFRAA 90 01/24/2020   QFTBGOLDPLUS NEGATIVE 01/24/2020    Speciality Comments: No specialty comments available.  Procedures:  No procedures performed Allergies: Patient has no known allergies.   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  He has been holding Cosentyx and Rasuvo for the past 5 weeks due to being diagnosed with COVID-19 in December 2021.  His symptoms were mild and self resolving.  He did not receive the monoclonal antibody infusion.  According to the patient he has not noticed a significant increase in joint pain, inflammation, or stiffness while being off of his medications.  He has a few small patches of psoriasis on extensor surface of both  elbows.  He has not had any Achilles tendinitis or plantar fasciitis.  He has no SI joint tenderness palpation on exam. X-rays of both hands and feet were obtained today to assess for radiographic progression.   He plans on resuming Cosentyx and Rasuvo tomorrow as prescribed.  He will continue on the current treatment regimen.  He was advised to notify us if he develops increased joint pain or joint swelling.  He will follow-up in the office in 5 months.- Plan: XR Hand 2 View Left, XR Hand 2 View Right, XR Foot 2 Views Left, XR Foot 2 Views Right  Psoriasis: He has a few small patches of psoriasis on the extensor surface of both elbows.  He has a prescription for clobetasol cream which he uses as needed.  He does not need a refill at this time.  He will be restarting Cosentyx and Rasuvo tomorrow.  High risk medication use - Cosentyx 300 mg sq q month, Rasuvo 20mg  sq q wk, Folic acid 2 mg po qd. (Failed Enbrel & Humira). CBC and CMP updated on 01/24/20.  He will be due to update lab work in March and every 3 months.  Standing orders for CBC and CMP are in place.   Standing orders for CBC and CMP were placed today.  TB gold negative on 01/24/20 and will continue to be monitored yearly.  He was diagnosed with covid-19 in December 2021.  His symptoms were mild and self resolving.  He did not receive the monoclonal antibody infusion. He has been holding cosentyx and rasvuo for the past 5 weeks and plans on resuming these injections tomorrow.   He was advised that he will be eligible to receive the 4th pfizer booster dose 5 months after her last injection.  Advised to hold rasuvo for 1 week after the 4th dose.  Advised to avoid tylenol and NSAIDs 24 hours prior to the 4th dose.    [facility] He has no SI joint tenderness to palpation.    Other medical conditions are listed as follows:   History of obesity  History of hypertension  Orders: Orders Placed This Encounter  Procedures  . XR Hand 2 View Left  . XR Hand 2 View Right  . XR Foot 2 Views Left  . XR Foot 2 Views Right   No orders of the defined types were placed in this encounter.     Follow-Up Instructions: Return in about 5 months (around 08/16/2020) for Psoriatic arthritis.   10/17/2020, PA-C  Note - This record has been created using Dragon software.  Chart creation errors have been sought, but may not always  have been located. Such creation errors do not reflect on  the standard of medical care.

## 2020-03-19 ENCOUNTER — Ambulatory Visit: Payer: Self-pay

## 2020-03-19 ENCOUNTER — Ambulatory Visit (INDEPENDENT_AMBULATORY_CARE_PROVIDER_SITE_OTHER): Payer: BC Managed Care – PPO | Admitting: Physician Assistant

## 2020-03-19 ENCOUNTER — Other Ambulatory Visit: Payer: Self-pay

## 2020-03-19 ENCOUNTER — Encounter: Payer: Self-pay | Admitting: Physician Assistant

## 2020-03-19 VITALS — BP 157/92 | HR 60 | Resp 16 | Ht 70.0 in | Wt 233.6 lb

## 2020-03-19 DIAGNOSIS — M79671 Pain in right foot: Secondary | ICD-10-CM

## 2020-03-19 DIAGNOSIS — M461 Sacroiliitis, not elsewhere classified: Secondary | ICD-10-CM | POA: Diagnosis not present

## 2020-03-19 DIAGNOSIS — Z79899 Other long term (current) drug therapy: Secondary | ICD-10-CM | POA: Diagnosis not present

## 2020-03-19 DIAGNOSIS — M79642 Pain in left hand: Secondary | ICD-10-CM | POA: Diagnosis not present

## 2020-03-19 DIAGNOSIS — M79672 Pain in left foot: Secondary | ICD-10-CM

## 2020-03-19 DIAGNOSIS — L409 Psoriasis, unspecified: Secondary | ICD-10-CM | POA: Diagnosis not present

## 2020-03-19 DIAGNOSIS — L4050 Arthropathic psoriasis, unspecified: Secondary | ICD-10-CM

## 2020-03-19 DIAGNOSIS — Z8639 Personal history of other endocrine, nutritional and metabolic disease: Secondary | ICD-10-CM

## 2020-03-19 DIAGNOSIS — M79641 Pain in right hand: Secondary | ICD-10-CM | POA: Diagnosis not present

## 2020-03-19 DIAGNOSIS — Z8679 Personal history of other diseases of the circulatory system: Secondary | ICD-10-CM

## 2020-03-19 NOTE — Patient Instructions (Signed)
Standing Labs We placed an order today for your standing lab work.   Please have your standing labs drawn in March and every 3 months   If possible, please have your labs drawn 2 weeks prior to your appointment so that the provider can discuss your results at your appointment.  We have open lab daily Monday through Thursday from 1:30-4:30 PM and Friday from 1:30-4:00 PM at the office of Dr. Shaili Deveshwar, [facility].   Please be advised, all patients with office appointments requiring lab work will take precedents over walk-in lab work.  If possible, please come for your lab work on Monday and Friday afternoons, as you may experience shorter wait times. The office is located at [address], [address], [place], [place] [address] No appointment is necessary.   Labs are drawn by [facility]. Please bring your co-pay at the time of your lab draw.  You may receive a bill from [facility] for your lab work.  If you wish to have your labs drawn at another location, please call the office 24 hours in advance to send orders.  If you have any questions regarding directions or hours of operation,  please call [phone].   As a reminder, please drink plenty of water prior to coming for your lab work. Thanks!   

## 2020-03-20 NOTE — Progress Notes (Signed)
I called the patient to review x-ray results.  We will repeat x-rays in 2-3 years to assess for radiographic progression.

## 2020-05-21 ENCOUNTER — Other Ambulatory Visit: Payer: Self-pay | Admitting: Rheumatology

## 2020-05-21 NOTE — Telephone Encounter (Signed)
Next Visit: 08/20/2020  Last Visit: 03/19/2020  Last Fill: 01/22/2020  DX: Psoriatic arthritis  Current Dose per office note on 03/19/2020: Rasuvo 20mg  sq q wk  Labs: 04/02/2020 RBC 4.46, hemoglobin 13.8, CMP WNL   Called patient to clarify if he is taking rasuvo on schedule and he verified that he is.   Okay to refill rasuvo?

## 2020-07-22 ENCOUNTER — Other Ambulatory Visit: Payer: Self-pay

## 2020-07-22 DIAGNOSIS — Z79899 Other long term (current) drug therapy: Secondary | ICD-10-CM

## 2020-07-23 LAB — CBC WITH DIFFERENTIAL/PLATELET
Absolute Monocytes: 521 cells/uL (ref 200–950)
Basophils Absolute: 33 cells/uL (ref 0–200)
Basophils Relative: 0.5 %
Eosinophils Absolute: 79 cells/uL (ref 15–500)
Eosinophils Relative: 1.2 %
HCT: 44.6 % (ref 38.5–50.0)
Hemoglobin: 15.2 g/dL (ref 13.2–17.1)
Lymphs Abs: 1881 cells/uL (ref 850–3900)
MCH: 31 pg (ref 27.0–33.0)
MCHC: 34.1 g/dL (ref 32.0–36.0)
MCV: 91 fL (ref 80.0–100.0)
MPV: 11.7 fL (ref 7.5–12.5)
Monocytes Relative: 7.9 %
Neutro Abs: 4085 cells/uL (ref 1500–7800)
Neutrophils Relative %: 61.9 %
Platelets: 229 10*3/uL (ref 140–400)
RBC: 4.9 10*6/uL (ref 4.20–5.80)
RDW: 13.5 % (ref 11.0–15.0)
Total Lymphocyte: 28.5 %
WBC: 6.6 10*3/uL (ref 3.8–10.8)

## 2020-07-23 LAB — COMPLETE METABOLIC PANEL WITH GFR
AG Ratio: 1.6 (calc) (ref 1.0–2.5)
ALT: 19 U/L (ref 9–46)
AST: 18 U/L (ref 10–35)
Albumin: 4.2 g/dL (ref 3.6–5.1)
Alkaline phosphatase (APISO): 71 U/L (ref 35–144)
BUN: 13 mg/dL (ref 7–25)
CO2: 28 mmol/L (ref 20–32)
Calcium: 9.4 mg/dL (ref 8.6–10.3)
Chloride: 104 mmol/L (ref 98–110)
Creat: 1.07 mg/dL (ref 0.70–1.33)
GFR, Est African American: 91 mL/min/{1.73_m2} (ref >=60)
GFR, Est Non African American: 78 mL/min/{1.73_m2} (ref >=60)
Globulin: 2.6 g/dL (calc) (ref 1.9–3.7)
Glucose, Bld: 124 mg/dL — ABNORMAL HIGH (ref 65–99)
Potassium: 4.3 mmol/L (ref 3.5–5.3)
Sodium: 140 mmol/L (ref 135–146)
Total Bilirubin: 0.4 mg/dL (ref 0.2–1.2)
Total Protein: 6.8 g/dL (ref 6.1–8.1)

## 2020-07-23 NOTE — Progress Notes (Signed)
Glucose is 124. Rest of CMP WNL.  CBC WNL.

## 2020-08-06 NOTE — Progress Notes (Signed)
Office Visit Note  Patient: Michael Peck             Date of Birth: 1965-04-14           MRN: [id number]             PCP: Jerl Mina, MD Referring: Jerl Mina, MD Visit Date: 08/20/2020 Occupation: @GUAROCC @  Subjective:  Medication management.   History of Present Illness: Kaimen Peine is a 55 y.o. male with a history of severe erosive psoriatic arthritis and psoriasis.  He denies any joint pain or joint swelling.  He has noticed more psoriasis lesions.  He states that he has not been taking Cosentyx and Rasuvo on a regular basis.  He states he waits until he will start hurting and then he will take the medication.  He denies any side effects from the medications.  Activities of Daily Living:  Patient reports morning stiffness for 0 minutes.   Patient Denies nocturnal pain.  Difficulty dressing/grooming: Denies Difficulty climbing stairs: Denies Difficulty getting out of chair: Denies Difficulty using hands for taps, buttons, cutlery, and/or writing: Denies  Review of Systems  Constitutional:  Negative for fatigue.  HENT:  Negative for mouth sores, mouth dryness and nose dryness.   Eyes:  Positive for dryness. Negative for pain and itching.  Respiratory:  Negative for shortness of breath and difficulty breathing.   Cardiovascular:  Negative for chest pain and palpitations.  Gastrointestinal:  Negative for blood in stool, constipation and diarrhea.  Endocrine: Negative for increased urination.  Genitourinary:  Negative for difficulty urinating.  Musculoskeletal:  Positive for joint pain, joint pain and joint swelling. Negative for myalgias, morning stiffness, muscle tenderness and myalgias.  Skin:  Positive for rash. Negative for color change.  Allergic/Immunologic: Negative for susceptible to infections.  Neurological:  Negative for dizziness, numbness, headaches, memory loss and weakness.  Hematological:  Negative for bruising/bleeding tendency.   Psychiatric/Behavioral:  Negative for confusion.    PMFS History:  Patient Active Problem List   Diagnosis Date Noted   High risk medication use 12/10/2015   Low back pain 12/10/2015   Obesity 12/10/2015   Psoriasis 12/09/2015   Psoriatic arthritis (HCC) 12/09/2015    Past Medical History:  Diagnosis Date   Arthritis    Hypertension    Psoriasis 12/09/2015   Psoriatic arthritis (HCC) 12/09/2015   Failed Enbrel    Family History  Problem Relation Age of Onset   Epilepsy Mother    Cancer Father        prostate    Healthy Daughter    Past Surgical History:  Procedure Laterality Date   ADENOIDECTOMY     Social History   Social History Narrative   Not on file   Immunization History  Administered Date(s) Administered   Influenza Inj Mdck Quad Pf 11/07/2016, 11/03/2017   Influenza, High Dose Seasonal PF 11/07/2016, 11/03/2017   Influenza,inj,Quad PF,6+ Mos 11/03/2018   Influenza-Unspecified 11/07/2016, 11/01/2019   PFIZER(Purple Top)SARS-COV-2 Vaccination 05/17/2019, 06/12/2019, 11/01/2019     Objective: Vital Signs: BP (!) 156/95 (BP Location: Left Arm, Patient Position: Sitting, Cuff Size: Normal)   Pulse 66   Ht 5\' 10"  (1.778 m)   Wt 232 lb (105.2 kg)   BMI 33.29 kg/m    Physical Exam Vitals and nursing note reviewed.  Constitutional:      Appearance: He is well-developed.  HENT:     Head: Normocephalic and atraumatic.  Eyes:     Conjunctiva/sclera: Conjunctivae normal.  Pupils: Pupils are equal, round, and reactive to light.  Cardiovascular:     Rate and Rhythm: Normal rate and regular rhythm.     Heart sounds: Normal heart sounds.  Pulmonary:     Effort: Pulmonary effort is normal.     Breath sounds: Normal breath sounds.  Abdominal:     General: Bowel sounds are normal.     Palpations: Abdomen is soft.  Musculoskeletal:     Cervical back: Normal range of motion and neck supple.  Skin:    General: Skin is warm and dry.     Capillary Refill:  Capillary refill takes less than 2 seconds.  Neurological:     Mental Status: He is alert and oriented to person, place, and time.  Psychiatric:        Behavior: Behavior normal.     Musculoskeletal Exam: C-spine was in good range of motion.  He had no SI joint tenderness.  Shoulder joints, elbow joints and wrist joints with good range of motion.  He has bilateral PIP and DIP thickening with no synovitis.  Telescoping of the fingers was noted due to severe psoriatic arthritis and arthritis midlands.  Hip joints and knee joints with good range of motion.  There was no tenderness over ankles or MTPs.  CDAI Exam: CDAI Score: -- Patient Global: --; Provider Global: -- Swollen: --; Tender: -- Joint Exam 08/20/2020   No joint exam has been documented for this visit   There is currently no information documented on the homunculus. Go to the Rheumatology activity and complete the homunculus joint exam.  Investigation: No additional findings.  Imaging: No results found.  Recent Labs: Lab Results  Component Value Date   WBC 6.6 07/22/2020   HGB 15.2 07/22/2020   PLT 229 07/22/2020   NA 140 07/22/2020   K 4.3 07/22/2020   CL 104 07/22/2020   CO2 28 07/22/2020   GLUCOSE 124 (H) 07/22/2020   BUN 13 07/22/2020   CREATININE 1.07 07/22/2020   BILITOT 0.4 07/22/2020   ALKPHOS 84 02/12/2019   AST 18 07/22/2020   ALT 19 07/22/2020   PROT 6.8 07/22/2020   ALBUMIN 4.2 02/12/2019   CALCIUM 9.4 07/22/2020   GFRAA 91 07/22/2020   QFTBGOLDPLUS NEGATIVE 01/24/2020    Speciality Comments: No specialty comments available.  Procedures:  No procedures performed Allergies: Patient has no known allergies.   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  Although he has not been taking Cosentyx and Rasuvo on a regular basis.  He states he has been  skipping some doses in between until he starts having some symptoms.  He has noticed more psoriasis patches.  He had psoriasis over his elbows, bilateral lower extremities.  We detailed discussion about taking medications on a regular basis.  He is aggressive disease with arthritis purulence.  Psoriasis-psoriasis patches were noted over elbows, lower extremities and his feet.  High risk medication use - Cosentyx 300 mg sq q month, Rasuvo 20mg  sq q wk, Folic acid 2 mg po qd. (Failed Enbrel & Humira).  Labs obtained on July 22, 2020 were normal.  TB gold was negative on January 24, 2020.  He will get labs in September and every 3 months to monitor for drug toxicity.  Repeat TB Gold will be in December.  He has been advised to hold medications in  case he develops an infection and resume once infection resolves.  He is planning to get a booster for COVID-19.  He was advised to hold methotrexate for 1 to 2 weeks after the booster.  Recommendations regarding other vaccines were also placed in the AVS.  Sacroiliitis (HCC)-he had no tenderness over SI joints.  History of hypertension-his blood pressure is a still elevated.  He has been advised to monitor blood pressure closely and follow-up with his PCP.  History of obesity-increased risk of heart disease with psoriatic arthritis was discussed.  Handout on dietary modifications and exercises was given.  Orders: No orders of the defined types were placed in this encounter.  No orders of the defined types were placed in this encounter.    Follow-Up Instructions: Return for Psoriatic arthritis.   Pollyann Savoy, MD  Note - This record has been created using Animal nutritionist.  Chart creation errors have been sought, but may not always  have been located. Such creation errors do not reflect on  the standard of medical care.

## 2020-08-20 ENCOUNTER — Ambulatory Visit (INDEPENDENT_AMBULATORY_CARE_PROVIDER_SITE_OTHER): Payer: BC Managed Care – PPO | Admitting: Rheumatology

## 2020-08-20 ENCOUNTER — Encounter: Payer: Self-pay | Admitting: Rheumatology

## 2020-08-20 ENCOUNTER — Other Ambulatory Visit: Payer: Self-pay

## 2020-08-20 VITALS — BP 156/95 | HR 66 | Ht 70.0 in | Wt 232.0 lb

## 2020-08-20 DIAGNOSIS — M461 Sacroiliitis, not elsewhere classified: Secondary | ICD-10-CM | POA: Diagnosis not present

## 2020-08-20 DIAGNOSIS — Z79899 Other long term (current) drug therapy: Secondary | ICD-10-CM

## 2020-08-20 DIAGNOSIS — L4050 Arthropathic psoriasis, unspecified: Secondary | ICD-10-CM

## 2020-08-20 DIAGNOSIS — L409 Psoriasis, unspecified: Secondary | ICD-10-CM | POA: Diagnosis not present

## 2020-08-20 DIAGNOSIS — Z8639 Personal history of other endocrine, nutritional and metabolic disease: Secondary | ICD-10-CM

## 2020-08-20 DIAGNOSIS — Z8679 Personal history of other diseases of the circulatory system: Secondary | ICD-10-CM

## 2020-08-20 NOTE — Patient Instructions (Signed)
Standing Labs We placed an order today for your standing lab work.   Please have your standing labs drawn in September and every 3 months  If possible, please have your labs drawn 2 weeks prior to your appointment so that the provider can discuss your results at your appointment.  Please note that you may see your imaging and lab results in MyChart before we have reviewed them. We may be awaiting multiple results to interpret others before contacting you. Please allow our office up to 72 hours to thoroughly review all of the results before contacting the office for clarification of your results.  We have open lab daily: Monday through Thursday from 1:30-4:30 PM and Friday from 1:30-4:00 PM at the office of Dr. Kebrina Friend, [facility].   Please be advised, all patients with office appointments requiring lab work will take precedent over walk-in lab work.  If possible, please come for your lab work on Monday and Friday afternoons, as you may experience shorter wait times. The office is located at [address], [address], [place], [place] [address] No appointment is necessary.   Labs are drawn by [facility]. Please bring your co-pay at the time of your lab draw.  You may receive a bill from [facility] for your lab work.  If you wish to have your labs drawn at another location, please call the office 24 hours in advance to send orders.  If you have any questions regarding directions or hours of operation,  please call [phone].   As a reminder, please drink plenty of water prior to coming for your lab work. Thanks!   Vaccines You are taking a medication(s) that can suppress your immune system.  The following immunizations are recommended: Flu annually Covid-19  Td/Tdap (tetanus, diphtheria, pertussis) every 10 years Pneumonia (Prevnar 15 then Pneumovax 23 at least 1 year apart.  Alternatively, can take Prevnar 20 without needing additional dose) Shingrix (after age 50): 2  doses from 4 weeks to 6 months apart  Please check with your PCP to make sure you are up to date.   If you test POSITIVE for COVID19 and have MILD to MODERATE symptoms: First, call your PCP if you would like to receive COVID19 treatment AND Hold your medications during the infection and for at least 1 week after your symptoms have resolved: Injectable medication (Benlysta, Cimzia, Cosentyx, Enbrel, Humira, Orencia, Remicade, Simponi, Stelara, Taltz, Tremfya) Methotrexate Leflunomide (Arava) Mycophenolate (Cellcept) Xeljanz, Olumiant, or Rinvoq If you take Actemra or Kevzara, you DO NOT need to hold these for COVID19 infection.  If you test POSITIVE for COVID19 and have NO symptoms: First, call your PCP if you would like to receive COVID19 treatment AND Hold your medications for at least 10 days after the day that you tested positive Injectable medication (Benlysta, Cimzia, Cosentyx, Enbrel, Humira, Orencia, Remicade, Simponi, Stelara, Taltz, Tremfya) Methotrexate Leflunomide (Arava) Mycophenolate (Cellcept) Xeljanz, Olumiant, or Rinvoq If you take Actemra or Kevzara, you DO NOT need to hold these for COVID19 infection.  If you have signs or symptoms of an infection or start antibiotics: First, call your PCP for workup of your infection. Hold your medication through the infection, until you complete your antibiotics, and until symptoms resolve if you take the following: Injectable medication (Actemra, Benlysta, Cimzia, Cosentyx, Enbrel, Humira, Kevzara, Orencia, Remicade, Simponi, Stelara, Taltz, Tremfya) Methotrexate Leflunomide (Arava) Mycophenolate (Cellcept) Xeljanz, Olumiant, or Rinvoq  Heart Disease Prevention   Your inflammatory disease increases your risk of heart disease which includes   heart attack, stroke, atrial fibrillation (irregular heartbeats), high blood pressure, heart failure and atherosclerosis (plaque in the arteries).  It is important to reduce your risk by:    Keep blood pressure, cholesterol, and blood sugar at healthy levels   Smoking Cessation   Maintain a healthy weight  BMI 20-25   Eat a healthy diet  Plenty of fresh fruit, vegetables, and whole grains  Limit saturated fats, foods high in sodium, and added sugars  DASH and Mediterranean diet   Increase physical activity  Recommend moderate physically activity for 150 minutes per week/ 30 minutes a day for five days a week These can be broken up into three separate ten-minute sessions during the day.   Reduce Stress  Meditation, slow breathing exercises, yoga, coloring books  Dental visits twice a year   

## 2020-08-25 ENCOUNTER — Telehealth: Payer: Self-pay

## 2020-08-25 NOTE — Telephone Encounter (Signed)
Faxed a Prior Authorization request to [facility] regarding COSENTYX. Will update once we receive determination.  Phone# [phone] Fax# [phone]

## 2020-08-28 NOTE — Telephone Encounter (Signed)
Received notification from [facility] regarding a prior authorization for COSENTYX. Authorization has been APPROVED from 08/27/2020 to 08/27/2021.   Patient must fill through [facility]: [phone]  Authorization # [id number] [facility]

## 2020-10-01 ENCOUNTER — Telehealth: Payer: Self-pay

## 2020-10-01 NOTE — Telephone Encounter (Signed)
Submitted a FAXED Prior Authorization request to [facility] for RASUVO. Will update once we receive a response.   Fax# [phone] Phone# [phone]

## 2020-10-05 NOTE — Telephone Encounter (Signed)
Received notification from [facility] regarding a prior authorization for RASUVO. Authorization has been APPROVED from 10/02/20 to 10/02/21.   Patient can continue to fill through [facility]: [phone]  Authorization # [id number]  Chesley Mires, PharmD, MPH, BCPS Clinical Pharmacist (Rheumatology and Pulmonology)

## 2020-11-10 ENCOUNTER — Other Ambulatory Visit: Payer: Self-pay | Admitting: Rheumatology

## 2020-11-10 DIAGNOSIS — Z9225 Personal history of immunosupression therapy: Secondary | ICD-10-CM

## 2020-11-10 DIAGNOSIS — Z111 Encounter for screening for respiratory tuberculosis: Secondary | ICD-10-CM

## 2020-11-10 NOTE — Telephone Encounter (Signed)
Next Visit: 12/25/2020  Last Visit: 08/20/2020  Last Fill: 01/22/2020  [id number] arthritis   Current Dose per office note 08/20/2020: Cosentyx 300 mg sq q month  Per last office note:  Although he has not been taking Cosentyx and Rasuvo on a regular basis.  He states he has been skipping some doses in between until he starts having some symptoms.  Labs: 07/22/2020 Glucose is 124. Rest of CMP WNL.  CBC WNL.   TB Gold: 01/24/2020 Neg    Patient advised he is due to update labs. Patient states he will update this week.   Okay to refill Cosentyx?

## 2020-11-12 ENCOUNTER — Other Ambulatory Visit: Payer: Self-pay | Admitting: *Deleted

## 2020-11-12 DIAGNOSIS — Z79899 Other long term (current) drug therapy: Secondary | ICD-10-CM

## 2020-11-13 LAB — COMPLETE METABOLIC PANEL WITH GFR
AG Ratio: 1.6 (calc) (ref 1.0–2.5)
ALT: 13 U/L (ref 9–46)
AST: 15 U/L (ref 10–35)
Albumin: 4.3 g/dL (ref 3.6–5.1)
Alkaline phosphatase (APISO): 59 U/L (ref 35–144)
BUN: 10 mg/dL (ref 7–25)
CO2: 30 mmol/L (ref 20–32)
Calcium: 9.5 mg/dL (ref 8.6–10.3)
Chloride: 104 mmol/L (ref 98–110)
Creat: 0.99 mg/dL (ref 0.70–1.30)
Globulin: 2.7 g/dL (calc) (ref 1.9–3.7)
Glucose, Bld: 112 mg/dL — ABNORMAL HIGH (ref 65–99)
Potassium: 5.1 mmol/L (ref 3.5–5.3)
Sodium: 141 mmol/L (ref 135–146)
Total Bilirubin: 0.6 mg/dL (ref 0.2–1.2)
Total Protein: 7 g/dL (ref 6.1–8.1)
eGFR: 91 mL/min/{1.73_m2} (ref >=60)

## 2020-11-13 LAB — CBC WITH DIFFERENTIAL/PLATELET
Absolute Monocytes: 707 cells/uL (ref 200–950)
Basophils Absolute: 30 cells/uL (ref 0–200)
Basophils Relative: 0.4 %
Eosinophils Absolute: 91 cells/uL (ref 15–500)
Eosinophils Relative: 1.2 %
HCT: 45.3 % (ref 38.5–50.0)
Hemoglobin: 15.6 g/dL (ref 13.2–17.1)
Lymphs Abs: 2364 cells/uL (ref 850–3900)
MCH: 32 pg (ref 27.0–33.0)
MCHC: 34.4 g/dL (ref 32.0–36.0)
MCV: 93 fL (ref 80.0–100.0)
MPV: 11.6 fL (ref 7.5–12.5)
Monocytes Relative: 9.3 %
Neutro Abs: 4408 cells/uL (ref 1500–7800)
Neutrophils Relative %: 58 %
Platelets: 225 10*3/uL (ref 140–400)
RBC: 4.87 10*6/uL (ref 4.20–5.80)
RDW: 13.1 % (ref 11.0–15.0)
Total Lymphocyte: 31.1 %
WBC: 7.6 10*3/uL (ref 3.8–10.8)

## 2020-11-13 NOTE — Progress Notes (Signed)
Glucose is 112. Rest of CMP WNL.  CBC WNL.

## 2020-12-05 ENCOUNTER — Other Ambulatory Visit: Payer: Self-pay | Admitting: Rheumatology

## 2020-12-07 NOTE — Telephone Encounter (Signed)
Next Visit: 12/25/2020   Last Visit: 08/20/2020  Current Dose per office note on 08/20/2020: Folic acid 2 mg po qd  Last Fill: 12/01/2019  Okay to refill Folic Acid?

## 2020-12-11 NOTE — Progress Notes (Signed)
Office Visit Note  Patient: Michael Peck             Date of Birth: Jan 06, 1966           MRN: [id number]             PCP: Maryland Pink, MD Referring: Maryland Pink, MD Visit Date: 12/25/2020 Occupation: @GUAROCC @  Subjective:  Medication management   History of Present Illness: Michael Peck is a 55 y.o. male with a history of psoriatic arthritis and psoriasis.  He states he is doing well on the combination of Cosentyx and Rasuvo.  He has been taking Cosentyx 150 mg every 2 weeks.  He has not noticed any joint pain or joint swelling.  He has some stiffness in his hands especially in the morning.  He states he has been working long hours which has been hard on his joints.  He has some dryness on his elbows but no active psoriasis lesions.  Activities of Daily Living:  Patient reports morning stiffness for 3 hours.   Patient Reports nocturnal pain.  Difficulty dressing/grooming: Denies Difficulty climbing stairs: Denies Difficulty getting out of chair: Denies Difficulty using hands for taps, buttons, cutlery, and/or writing: Denies  Review of Systems  Constitutional:  Negative for fatigue and night sweats.  HENT:  Negative for mouth sores, mouth dryness and nose dryness.   Eyes:  Positive for dryness. Negative for redness.  Respiratory:  Negative for shortness of breath and difficulty breathing.   Cardiovascular:  Positive for swelling in legs/feet. Negative for chest pain, palpitations, hypertension and irregular heartbeat.  Gastrointestinal:  Negative for constipation and diarrhea.  Endocrine: Positive for heat intolerance. Negative for increased urination.  Genitourinary:  Negative for difficulty urinating.  Musculoskeletal:  Positive for joint pain, joint pain and morning stiffness. Negative for joint swelling, myalgias, muscle weakness, muscle tenderness and myalgias.  Skin:  Negative for color change, rash, hair loss, nodules/bumps, skin tightness, ulcers and sensitivity to  sunlight.  Allergic/Immunologic: Negative for susceptible to infections.  Neurological:  Negative for dizziness, fainting, numbness, memory loss, night sweats and weakness ( ).  Hematological:  Negative for bruising/bleeding tendency and swollen glands.  Psychiatric/Behavioral:  Negative for depressed mood and sleep disturbance. The patient is not nervous/anxious.    PMFS History:  Patient Active Problem List   Diagnosis Date Noted   High risk medication use 12/10/2015   Low back pain 12/10/2015   Obesity 12/10/2015   Psoriasis 12/09/2015   Psoriatic arthritis ([place]) 12/09/2015    Past Medical History:  Diagnosis Date   Arthritis    Hypertension    Psoriasis 12/09/2015   Psoriatic arthritis ([place]) 12/09/2015   Failed Enbrel    Family History  Problem Relation Age of Onset   Epilepsy Mother    Cancer Father        prostate    Healthy Daughter    Past Surgical History:  Procedure Laterality Date   ADENOIDECTOMY     Social History   Social History Narrative   Not on file   Immunization History  Administered Date(s) Administered   Influenza Inj Mdck Quad Pf 11/07/2016, 11/03/2017   Influenza, High Dose Seasonal PF 11/07/2016, 11/03/2017   Influenza,inj,Quad PF,6+ Mos 11/03/2018   Influenza-Unspecified 11/07/2016, 11/01/2019   PFIZER(Purple Top)SARS-COV-2 Vaccination 05/17/2019, 06/12/2019, 11/01/2019     Objective: Vital Signs: BP (!) 164/97 (BP Location: Left Arm, Patient Position: Sitting, Cuff Size: Large)   Pulse 67   Resp 12   Ht 5\' 10"  (1.778  m)   Wt 230 lb (104.3 kg)   BMI 33.00 kg/m    Physical Exam Vitals and nursing note reviewed.  Constitutional:      Appearance: He is well-developed.  HENT:     Head: Normocephalic and atraumatic.  Eyes:     Conjunctiva/sclera: Conjunctivae normal.     Pupils: Pupils are equal, round, and reactive to light.  Cardiovascular:     Rate and Rhythm: Normal rate and regular rhythm.     Heart sounds: Normal heart  sounds.  Pulmonary:     Effort: Pulmonary effort is normal.     Breath sounds: Normal breath sounds.  Abdominal:     General: Bowel sounds are normal.     Palpations: Abdomen is soft.  Musculoskeletal:     Cervical back: Normal range of motion and neck supple.  Skin:    General: Skin is warm and dry.     Capillary Refill: Capillary refill takes less than 2 seconds.     Comments: Dry skin was noted over bilateral elbows.  Neurological:     Mental Status: He is alert and oriented to person, place, and time.  Psychiatric:        Behavior: Behavior normal.     Musculoskeletal Exam: C-spine thoracic and lumbar spine were in good range of motion with no SI joint tenderness.  Shoulder joints, elbow joints, wrist joints, MCPs PIPs and DIPs with good range of motion.  He is telescoping of his bilateral fourth and fifth fingers due to psoriatic arthritis.  Hip joints, knee joints, ankles were in good range of motion.  He had no tenderness over ankles or MTPs.  CDAI Exam: CDAI Score: -- Patient Global: --; Provider Global: -- Swollen: --; Tender: -- Joint Exam 12/25/2020   No joint exam has been documented for this visit   There is currently no information documented on the homunculus. Go to the Rheumatology activity and complete the homunculus joint exam.  Investigation: No additional findings.  Imaging: No results found.  Recent Labs: Lab Results  Component Value Date   WBC 7.6 11/12/2020   HGB 15.6 11/12/2020   PLT 225 11/12/2020   NA 141 11/12/2020   K 5.1 11/12/2020   CL 104 11/12/2020   CO2 30 11/12/2020   GLUCOSE 112 (H) 11/12/2020   BUN 10 11/12/2020   CREATININE 0.99 11/12/2020   BILITOT 0.6 11/12/2020   ALKPHOS 84 02/12/2019   AST 15 11/12/2020   ALT 13 11/12/2020   PROT 7.0 11/12/2020   ALBUMIN 4.2 02/12/2019   CALCIUM 9.5 11/12/2020   GFRAA 91 07/22/2020   QFTBGOLDPLUS NEGATIVE 01/24/2020    Speciality Comments: No specialty comments  available.  Procedures:  No procedures performed Allergies: Patient has no known allergies.   Assessment / Plan:     Visit Diagnoses: Psoriatic arthritis ([place]) - Erosive disease with dactylitis and sacroiliitis in the past: He is doing well on the combination of Cosentyx and Rasuvo.  He had no active synovitis on my examination today.  He denies any history of Achilles tendinitis or plantar fasciitis.  There is no history of uveitis.  Psoriasis-he had no active psoriasis patches but had some dry scales on his elbows.  Use of topical clobetasol was discussed.  High risk medication use - Cosentyx 300 mg sq q month (he takes 150 mg subcutaneous every 2 weeks), Rasuvo 20mg  sq q wk, Folic acid 2 mg po qd. (Failed Enbrel & Humira).  Labs from November 12, 2020 were  reviewed which were within normal limits.  TB gold was negative on January 24, 2020.  He has been advised to get labs in January which will include CBC with differential, CMP with GFR and TB Gold.  Information regarding immunization was also placed in the AVS.  He was advised to hold Cosentyx and resume in case he develops an infection and restart after the infection resolves.  Sacroiliitis (HCC)-he had no tenderness on examination today.  History of hypertension-his blood pressure was elevated.  Increased risk of heart disease with psoriatic arthritis was discussed.  Dietary modifications and exercise was emphasized.  History of obesity  Orders: No orders of the defined types were placed in this encounter.  No orders of the defined types were placed in this encounter.    Follow-Up Instructions: Return in about 5 months (around 05/25/2021) for Psoriatic arthritis.   Pollyann Savoy, MD  Note - This record has been created using Animal nutritionist.  Chart creation errors have been sought, but may not always  have been located. Such creation errors do not reflect on  the standard of medical care.

## 2020-12-25 ENCOUNTER — Other Ambulatory Visit: Payer: Self-pay

## 2020-12-25 ENCOUNTER — Ambulatory Visit (INDEPENDENT_AMBULATORY_CARE_PROVIDER_SITE_OTHER): Payer: BC Managed Care – PPO | Admitting: Rheumatology

## 2020-12-25 ENCOUNTER — Encounter: Payer: Self-pay | Admitting: Rheumatology

## 2020-12-25 VITALS — BP 164/97 | HR 67 | Resp 12 | Ht 70.0 in | Wt 230.0 lb

## 2020-12-25 DIAGNOSIS — M461 Sacroiliitis, not elsewhere classified: Secondary | ICD-10-CM

## 2020-12-25 DIAGNOSIS — L409 Psoriasis, unspecified: Secondary | ICD-10-CM | POA: Diagnosis not present

## 2020-12-25 DIAGNOSIS — Z8679 Personal history of other diseases of the circulatory system: Secondary | ICD-10-CM

## 2020-12-25 DIAGNOSIS — Z8639 Personal history of other endocrine, nutritional and metabolic disease: Secondary | ICD-10-CM

## 2020-12-25 DIAGNOSIS — Z79899 Other long term (current) drug therapy: Secondary | ICD-10-CM

## 2020-12-25 DIAGNOSIS — L4050 Arthropathic psoriasis, unspecified: Secondary | ICD-10-CM | POA: Diagnosis not present

## 2020-12-25 DIAGNOSIS — U071 COVID-19: Secondary | ICD-10-CM

## 2020-12-25 NOTE — Patient Instructions (Signed)
Standing Labs We placed an order today for your standing lab work.   Please have your standing labs drawn in January and every 3 months  If possible, please have your labs drawn 2 weeks prior to your appointment so that the provider can discuss your results at your appointment.  Please note that you may see your imaging and lab results in MyChart before we have reviewed them. We may be awaiting multiple results to interpret others before contacting you. Please allow our office up to 72 hours to thoroughly review all of the results before contacting the office for clarification of your results.  We have open lab daily: Monday through Thursday from 1:30-4:30 PM and Friday from 1:30-4:00 PM at the office of Dr. Destinie Thornsberry,  Rheumatology.   Please be advised, all patients with office appointments requiring lab work will take precedent over walk-in lab work.  If possible, please come for your lab work on Monday and Friday afternoons, as you may experience shorter wait times. The office is located at [address], [address], [place], [place] [address] No appointment is necessary.   Labs are drawn by [facility]. Please bring your co-pay at the time of your lab draw.  You may receive a bill from [facility] for your lab work.  If you wish to have your labs drawn at another location, please call the office 24 hours in advance to send orders.  If you have any questions regarding directions or hours of operation,  please call [phone].   As a reminder, please drink plenty of water prior to coming for your lab work. Thanks!   Vaccines You are taking a medication(s) that can suppress your immune system.  The following immunizations are recommended: Flu annually Covid-19  Td/Tdap (tetanus, diphtheria, pertussis) every 10 years Pneumonia (Prevnar 15 then Pneumovax 23 at least 1 year apart.  Alternatively, can take Prevnar 20 without needing additional dose) Shingrix: 2 doses from 4 weeks  to 6 months apart  Please check with your PCP to make sure you are up to date.   If you have signs or symptoms of an infection or start antibiotics: First, call your PCP for workup of your infection. Hold your medication through the infection, until you complete your antibiotics, and until symptoms resolve if you take the following: Injectable medication (Actemra, Benlysta, Cimzia, Cosentyx, Enbrel, Humira, Kevzara, Orencia, Remicade, Simponi, Stelara, Taltz, Tremfya) Methotrexate Leflunomide (Arava) Mycophenolate (Cellcept) Xeljanz, Olumiant, or Rinvoq  

## 2021-02-16 ENCOUNTER — Other Ambulatory Visit: Payer: Self-pay | Admitting: Physician Assistant

## 2021-02-16 NOTE — Telephone Encounter (Signed)
Next Visit: 05/28/2021  Last Visit: 12/25/2020  Last Fill: 11/10/2020 (Cosentyx), 05/21/2020 Chaya Jan)  [id number] arthritis   Current Dose per office note 12/25/2020: Cosentyx 300 mg sq q month (he takes 150 mg subcutaneous every 2 weeks), Rasuvo 20mg  sq q wk,  Labs: 11/12/2020 Glucose is 112.  Rest of CMP WNL.  CBC WNL.    TB Gold: 01/24/2020 Neg   Left message to advise patient he is due to update labs.   Okay to refill Cosentyx and Rasuvo?

## 2021-03-04 ENCOUNTER — Other Ambulatory Visit: Payer: BC Managed Care – PPO | Admitting: *Deleted

## 2021-03-04 DIAGNOSIS — Z9225 Personal history of immunosupression therapy: Secondary | ICD-10-CM

## 2021-03-04 DIAGNOSIS — Z79899 Other long term (current) drug therapy: Secondary | ICD-10-CM

## 2021-03-04 DIAGNOSIS — Z111 Encounter for screening for respiratory tuberculosis: Secondary | ICD-10-CM

## 2021-03-05 NOTE — Progress Notes (Signed)
CBC and CMP WNL

## 2021-03-07 LAB — COMPLETE METABOLIC PANEL WITH GFR
AG Ratio: 1.6 (calc) (ref 1.0–2.5)
ALT: 15 U/L (ref 9–46)
AST: 16 U/L (ref 10–35)
Albumin: 4.2 g/dL (ref 3.6–5.1)
Alkaline phosphatase (APISO): 58 U/L (ref 35–144)
BUN: 14 mg/dL (ref 7–25)
CO2: 30 mmol/L (ref 20–32)
Calcium: 9.3 mg/dL (ref 8.6–10.3)
Chloride: 104 mmol/L (ref 98–110)
Creat: 1.1 mg/dL (ref 0.70–1.30)
Globulin: 2.7 g/dL (calc) (ref 1.9–3.7)
Glucose, Bld: 73 mg/dL (ref 65–99)
Potassium: 4.4 mmol/L (ref 3.5–5.3)
Sodium: 141 mmol/L (ref 135–146)
Total Bilirubin: 0.5 mg/dL (ref 0.2–1.2)
Total Protein: 6.9 g/dL (ref 6.1–8.1)
eGFR: 80 mL/min/{1.73_m2} (ref >=60)

## 2021-03-07 LAB — CBC WITH DIFFERENTIAL/PLATELET
Absolute Monocytes: 774 cells/uL (ref 200–950)
Basophils Absolute: 43 cells/uL (ref 0–200)
Basophils Relative: 0.5 %
Eosinophils Absolute: 95 cells/uL (ref 15–500)
Eosinophils Relative: 1.1 %
HCT: 44.2 % (ref 38.5–50.0)
Hemoglobin: 14.9 g/dL (ref 13.2–17.1)
Lymphs Abs: 2055 cells/uL (ref 850–3900)
MCH: 30.8 pg (ref 27.0–33.0)
MCHC: 33.7 g/dL (ref 32.0–36.0)
MCV: 91.3 fL (ref 80.0–100.0)
MPV: 11.4 fL (ref 7.5–12.5)
Monocytes Relative: 9 %
Neutro Abs: 5633 cells/uL (ref 1500–7800)
Neutrophils Relative %: 65.5 %
Platelets: 224 10*3/uL (ref 140–400)
RBC: 4.84 10*6/uL (ref 4.20–5.80)
RDW: 13.4 % (ref 11.0–15.0)
Total Lymphocyte: 23.9 %
WBC: 8.6 10*3/uL (ref 3.8–10.8)

## 2021-03-07 LAB — QUANTIFERON-TB GOLD PLUS
Mitogen-NIL: >10 IU/mL
NIL: 0.03 IU/mL
QuantiFERON-TB Gold Plus: NEGATIVE
TB1-NIL: 0.02 IU/mL
TB2-NIL: 0.02 IU/mL

## 2021-03-08 NOTE — Progress Notes (Signed)
TB gold negative

## 2021-05-19 NOTE — Progress Notes (Signed)
? ?Office Visit Note ? ?Patient: Michael Peck             ?Date of Birth: 06-19-1965           ?MRN: [id number]             ?PCP: Jerl Mina, MD ?Referring: Jerl Mina, MD ?Visit Date: 05/28/2021 ?Occupation: @GUAROCC @ ? ?Subjective:  ?Medication monitoring  ? ?History of Present Illness: Michael Peck is a 55 y.o. male with history of psoriatic arthritis.  He remains on Cosentyx 150 mg subcutaneous every 2 weeks, Rasuvo 20mg  sq q wk,  and folic acid 2 mg po qd. patient reports that he is 1 week overdue for his Cosentyx injection due to requiring an updated refill.  He requested a sample of Cosentyx today.  Patient reports that he has occasional SI joint discomfort but denies any nocturnal pain.  He has occasional pain and stiffness in his hands but denies any joint swelling at this time.  Overall his symptoms remain stable on the current treatment regimen.  He has a few patches of psoriasis on bilateral lower extremities.  He requested a refill clobetasol to be sent to the pharmacy today. ?He denies any recent infections. ?He denies any new medical conditions. ? ?Activities of Daily Living:  ?Patient reports morning stiffness for 2 hours.   ?Patient Denies nocturnal pain.  ?Difficulty dressing/grooming: Denies ?Difficulty climbing stairs: Denies ?Difficulty getting out of chair: Denies ?Difficulty using hands for taps, buttons, cutlery, and/or writing: Denies ? ?Review of Systems  ?Constitutional:  Negative for fatigue.  ?HENT:  Negative for mouth sores, mouth dryness and nose dryness.   ?Eyes:  Positive for dryness. Negative for pain and itching.  ?Respiratory:  Negative for shortness of breath and difficulty breathing.   ?Cardiovascular:  Negative for chest pain and palpitations.  ?Gastrointestinal:  Negative for blood in stool, constipation and diarrhea.  ?Endocrine: Negative for increased urination.  ?Genitourinary:  Negative for difficulty urinating.  ?Musculoskeletal:  Positive for myalgias, morning  stiffness and myalgias. Negative for joint pain, joint pain, joint swelling and muscle tenderness.  ?Skin:  Positive for rash. Negative for color change.  ?Allergic/Immunologic: Negative for susceptible to infections.  ?Neurological:  Negative for dizziness, numbness, headaches, memory loss and weakness.  ?Hematological:  Negative for bruising/bleeding tendency.  ?Psychiatric/Behavioral:  Negative for confusion.   ? ?PMFS History:  ?Patient Active Problem List  ? Diagnosis Date Noted  ? High risk medication use 12/10/2015  ? Low back pain 12/10/2015  ? Obesity 12/10/2015  ? Psoriasis 12/09/2015  ? Psoriatic arthritis (HCC) 12/09/2015  ?  ?Past Medical History:  ?Diagnosis Date  ? Arthritis   ? Hypertension   ? Psoriasis 12/09/2015  ? Psoriatic arthritis (HCC) 12/09/2015  ? Failed Enbrel  ?  ?Family History  ?Problem Relation Age of Onset  ? Epilepsy Mother   ? Cancer Father   ?     prostate   ? Healthy Daughter   ? ?Past Surgical History:  ?Procedure Laterality Date  ? ADENOIDECTOMY    ? ?Social History  ? ?Social History Narrative  ? Not on file  ? ?Immunization History  ?Administered Date(s) Administered  ? Influenza Inj Mdck Quad Pf 11/07/2016, 11/03/2017  ? Influenza, High Dose Seasonal PF 11/07/2016, 11/03/2017  ? Influenza,inj,Quad PF,6+ Mos 11/03/2018  ? Influenza-Unspecified 11/07/2016, 11/01/2019  ? PFIZER(Purple Top)SARS-COV-2 Vaccination 05/17/2019, 06/12/2019, 11/01/2019  ?  ? ?Objective: ?Vital Signs: BP (!) 164/93 (BP Location: Left Arm, Patient Position: Sitting, Cuff Size: Normal)  Pulse (!) 56   Ht 5\' 10"  (1.778 m)   Wt 217 lb 9.6 oz (98.7 kg)   BMI 31.22 kg/m?   ? ?Physical Exam ?Vitals and nursing note reviewed.  ?Constitutional:   ?   Appearance: He is well-developed.  ?HENT:  ?   Head: Normocephalic and atraumatic.  ?Eyes:  ?   Conjunctiva/sclera: Conjunctivae normal.  ?   Pupils: Pupils are equal, round, and reactive to light.  ?Cardiovascular:  ?   Rate and Rhythm: Normal rate and regular  rhythm.  ?   Heart sounds: Normal heart sounds.  ?Pulmonary:  ?   Effort: Pulmonary effort is normal.  ?   Breath sounds: Normal breath sounds.  ?Abdominal:  ?   General: Bowel sounds are normal.  ?   Palpations: Abdomen is soft.  ?Musculoskeletal:  ?   Cervical back: Normal range of motion and neck supple.  ?Skin: ?   General: Skin is warm and dry.  ?   Capillary Refill: Capillary refill takes less than 2 seconds.  ?Neurological:  ?   Mental Status: He is alert and oriented to person, place, and time.  ?Psychiatric:     ?   Behavior: Behavior normal.  ?  ? ?Musculoskeletal Exam: C-spine, thoracic spine, lumbar spine have good range of motion.  No midline spinal tenderness or SI joint tenderness to palpation today.  Shoulder joints, elbow joints, and wrist joints have good range of motion with no joint tenderness.  Arthritis medial hands of bilateral fourth and fifth fingers due to psoriatic arthritis noted.  Hip joints have good range of motion with no groin pain.  Knee joints have good range of motion with no warmth or effusion.  Ankle joints have good range of motion with no tenderness or joint swelling.  No tenderness over MTP joints.  Overcrowding of toes noted.  No evidence of achilles tendonitis or plantar fasciitis.  ? ?CDAI Exam: ?CDAI Score: -- ?Patient Global: --; Provider Global: -- ?Swollen: --; Tender: -- ?Joint Exam 05/28/2021  ? ?No joint exam has been documented for this visit  ? ?There is currently no information documented on the homunculus. Go to the Rheumatology activity and complete the homunculus joint exam. ? ?Investigation: ?No additional findings. ? ?Imaging: ?No results found. ? ?Recent Labs: ?Lab Results  ?Component Value Date  ? WBC 8.6 03/04/2021  ? HGB 14.9 03/04/2021  ? PLT 224 03/04/2021  ? NA 141 03/04/2021  ? K 4.4 03/04/2021  ? CL 104 03/04/2021  ? CO2 30 03/04/2021  ? GLUCOSE 73 03/04/2021  ? BUN 14 03/04/2021  ? CREATININE 1.10 03/04/2021  ? BILITOT 0.5 03/04/2021  ? ALKPHOS 84  02/12/2019  ? AST 16 03/04/2021  ? ALT 15 03/04/2021  ? PROT 6.9 03/04/2021  ? ALBUMIN 4.2 02/12/2019  ? CALCIUM 9.3 03/04/2021  ? GFRAA 91 07/22/2020  ? QFTBGOLDPLUS NEGATIVE 03/04/2021  ? ? ?Speciality Comments: No specialty comments available. ? ?Procedures:  ?No procedures performed ?Allergies: Patient has no known allergies.  ? ? ?Assessment / Plan:     ?Visit Diagnoses: Psoriatic arthritis (HCC) - Erosive disease with dactylitis and sacroiliitis in the past: He has no synovitis or dactylitis on examination today.  He has no SI joint tenderness upon palpation.  He has not had any nocturnal pain or increased morning stiffness.  He has no evidence of Achilles tendinitis or plantar fasciitis.  He experiences intermittent arthralgias and joint stiffness but overall his symptoms remain stable on Cosentyx and  Rasuvo combination.  He has about 1 week overdue for his Cosentyx dose so a sample was provided today in the office.  A refill of Cosentyx was sent to the pharmacy.  He is having a mild flare of psoriasis so a prescription for clobetasol cream was also sent to the pharmacy today.  He was advised to notify us if he develops increased joint pain or joint swelling.  He will follow-up in the office in 5 months or sooner if needed. ? ?Psoriasis: He has a few patches of psoriasis on his lower extremities as well as on the extensor surface of both elbows.  He is about 1 week overdue for his Cosentyx dose.  A sample of Cosentyx was provided to the patient today in the office along with a refill which was sent to the pharmacy.  A refill of clobetasol cream was also sent to the pharmacy to apply topically as needed. ? ?High risk medication use - Cosentyx 150 mg sq injections every 2 weeks, Rasuvo 20mg  sq q wk, and folic acid 2 mg po qd. (Failed Enbrel & Humira).   ?CBC and CMP updated on 03/04/21.   CBC and CMP will be updated today.  Orders were released.  His next lab work will be due in July and every 3 months to  monitor for drug toxicity.  Standing orders for CBC and CMP were placed today. ?- Plan: COMPLETE METABOLIC PANEL WITH GFR, CBC with Differential/Platelet, COMPLETE METABOLIC PANEL WITH GFR, CBC with Differential/Plat

## 2021-05-28 ENCOUNTER — Encounter: Payer: Self-pay | Admitting: Physician Assistant

## 2021-05-28 ENCOUNTER — Ambulatory Visit (INDEPENDENT_AMBULATORY_CARE_PROVIDER_SITE_OTHER): Payer: BC Managed Care – PPO | Admitting: Physician Assistant

## 2021-05-28 VITALS — BP 164/93 | HR 56 | Ht 70.0 in | Wt 217.6 lb

## 2021-05-28 DIAGNOSIS — Z79899 Other long term (current) drug therapy: Secondary | ICD-10-CM

## 2021-05-28 DIAGNOSIS — Z8639 Personal history of other endocrine, nutritional and metabolic disease: Secondary | ICD-10-CM

## 2021-05-28 DIAGNOSIS — L409 Psoriasis, unspecified: Secondary | ICD-10-CM

## 2021-05-28 DIAGNOSIS — Z8679 Personal history of other diseases of the circulatory system: Secondary | ICD-10-CM

## 2021-05-28 DIAGNOSIS — M461 Sacroiliitis, not elsewhere classified: Secondary | ICD-10-CM

## 2021-05-28 DIAGNOSIS — L4050 Arthropathic psoriasis, unspecified: Secondary | ICD-10-CM

## 2021-05-28 MED ORDER — COSENTYX SENSOREADY (300 MG) 150 MG/ML ~~LOC~~ SOAJ: SUBCUTANEOUS | 0 refills | Status: DC

## 2021-05-28 MED ORDER — CLOBETASOL PROPIONATE 0.05 % EX CREA
1.0000 | TOPICAL_CREAM | Freq: Two times a day (BID) | CUTANEOUS | 1 refills | Status: DC
Start: 2021-05-28 — End: 2021-10-28

## 2021-05-28 NOTE — Progress Notes (Signed)
Medication Samples have been provided to the patient. ? ?Drug name: cosentyx       Strength: 150mg /mL        Qty: 1 box (2 pens)  LOT: [id number]  Exp.Date: 04/06/2022 ? ?Dosing instructions: Inject 2 pens into the skin every 4 weeks.   ?

## 2021-05-28 NOTE — Patient Instructions (Signed)
Standing Labs ?We placed an order today for your standing lab work.  ? ?Please have your standing labs drawn in July and every 3 months  ? ?If possible, please have your labs drawn 2 weeks prior to your appointment so that the provider can discuss your results at your appointment. ? ?Please note that you may see your imaging and lab results in MyChart before we have reviewed them. ?We may be awaiting multiple results to interpret others before contacting you. ?Please allow our office up to 72 hours to thoroughly review all of the results before contacting the office for clarification of your results. ? ?We have open lab daily: ?Monday through Thursday from 1:30-4:30 PM and Friday from 1:30-4:00 PM ?at the office of Dr. Shaili Deveshwar, [facility].   ?Please be advised, all patients with office appointments requiring lab work will take precedent over walk-in lab work.  ?If possible, please come for your lab work on Monday and Friday afternoons, as you may experience shorter wait times. ?The office is located at [address], [address], [place], [place] [address] ?No appointment is necessary.   ?Labs are drawn by [facility]. Please bring your co-pay at the time of your lab draw.  You may receive a bill from [facility] for your lab work. ? ?Please note if you are on Hydroxychloroquine and and an order has been placed for a Hydroxychloroquine level, you will need to have it drawn 4 hours or more after your last dose. ? ?If you wish to have your labs drawn at another location, please call the office 24 hours in advance to send orders. ? ?If you have any questions regarding directions or hours of operation,  ?please call [phone].   ?As a reminder, please drink plenty of water prior to coming for your lab work. Thanks! ? ?

## 2021-05-29 LAB — CBC WITH DIFFERENTIAL/PLATELET
Absolute Monocytes: 598 cells/uL (ref 200–950)
Basophils Absolute: 29 cells/uL (ref 0–200)
Basophils Relative: 0.4 %
Eosinophils Absolute: 86 cells/uL (ref 15–500)
Eosinophils Relative: 1.2 %
HCT: 42.2 % (ref 38.5–50.0)
Hemoglobin: 14.5 g/dL (ref 13.2–17.1)
Lymphs Abs: 2030 cells/uL (ref 850–3900)
MCH: 32.4 pg (ref 27.0–33.0)
MCHC: 34.4 g/dL (ref 32.0–36.0)
MCV: 94.2 fL (ref 80.0–100.0)
MPV: 12.2 fL (ref 7.5–12.5)
Monocytes Relative: 8.3 %
Neutro Abs: 4457 cells/uL (ref 1500–7800)
Neutrophils Relative %: 61.9 %
Platelets: 214 10*3/uL (ref 140–400)
RBC: 4.48 10*6/uL (ref 4.20–5.80)
RDW: 13.8 % (ref 11.0–15.0)
Total Lymphocyte: 28.2 %
WBC: 7.2 10*3/uL (ref 3.8–10.8)

## 2021-05-29 LAB — COMPLETE METABOLIC PANEL WITH GFR
AG Ratio: 1.4 (calc) (ref 1.0–2.5)
ALT: 17 U/L (ref 9–46)
AST: 16 U/L (ref 10–35)
Albumin: 4 g/dL (ref 3.6–5.1)
Alkaline phosphatase (APISO): 65 U/L (ref 35–144)
BUN: 12 mg/dL (ref 7–25)
CO2: 29 mmol/L (ref 20–32)
Calcium: 9.3 mg/dL (ref 8.6–10.3)
Chloride: 105 mmol/L (ref 98–110)
Creat: 1.03 mg/dL (ref 0.70–1.30)
Globulin: 2.8 g/dL (calc) (ref 1.9–3.7)
Glucose, Bld: 90 mg/dL (ref 65–99)
Potassium: 4.3 mmol/L (ref 3.5–5.3)
Sodium: 140 mmol/L (ref 135–146)
Total Bilirubin: 0.4 mg/dL (ref 0.2–1.2)
Total Protein: 6.8 g/dL (ref 6.1–8.1)
eGFR: 86 mL/min/{1.73_m2} (ref >=60)

## 2021-05-31 NOTE — Progress Notes (Signed)
CBC and CMP WNL

## 2021-06-09 ENCOUNTER — Other Ambulatory Visit: Payer: Self-pay | Admitting: Physician Assistant

## 2021-06-09 NOTE — Telephone Encounter (Signed)
Next Visit: 10/28/2021 ? ?Last Visit: 05/28/2021 ? ?Last Fill: 02/16/2021 ? ?DX: Psoriatic arthritis  ? ?Current Dose per office note 05/28/2021: Rasuvo 20mg  sq q wk ? ?Labs: 05/28/2021 CBC and CMP WNL ? ?Okay to refill Rasuvo?  ?

## 2021-07-30 ENCOUNTER — Other Ambulatory Visit (HOSPITAL_COMMUNITY): Payer: Self-pay

## 2021-07-30 ENCOUNTER — Telehealth: Payer: Self-pay | Admitting: Pharmacist

## 2021-08-11 ENCOUNTER — Other Ambulatory Visit (HOSPITAL_COMMUNITY): Payer: Self-pay

## 2021-08-12 NOTE — Telephone Encounter (Signed)
Received notification from [facility] regarding a prior authorization for COSENTYX. Authorization has been APPROVED from 08/11/21 to 08/12/22.   Patient must continue to fill through [facility]: [phone]  Authorization # [id number]  Chesley Mires, PharmD, MPH, BCPS, CPP Clinical Pharmacist (Rheumatology and Pulmonology)

## 2021-09-23 ENCOUNTER — Telehealth: Payer: Self-pay | Admitting: Pharmacist

## 2021-09-23 NOTE — Telephone Encounter (Signed)
Submitted a Prior Authorization request to [facility] for RASUVO via fax. Will update once we receive a response.  Case ID # [id number] Fax: [phone] Phone: [phone]  Chesley Mires, PharmD, MPH, BCPS, CPP Clinical Pharmacist (Rheumatology and Pulmonology)

## 2021-09-24 NOTE — Telephone Encounter (Addendum)
Received notification from [facility] regarding a prior authorization for RASUVO. Authorization has been APPROVED from 09/23/21 to 09/24/22.   Patient must continue to fill through [facility]: [phone]  Authorization # [id number]  Chesley Mires, PharmD, MPH, BCPS, CPP Clinical Pharmacist (Rheumatology and Pulmonology)

## 2021-10-14 NOTE — Progress Notes (Signed)
Office Visit Note  Patient: Michael Peck             Date of Birth: 1966/01/17           MRN: [id number]             PCP: Jerl Mina, MD Referring: Jerl Mina, MD Visit Date: 10/28/2021 Occupation: @GUAROCC @  Subjective:  Ankle pain and psoriasis  History of Present Illness: Michael Peck is a 56 y.o. male with history of psoriatic arthritis, psoriasis.  He states he continues to have rash from psoriasis.  The rash is mostly on his elbows and also on his left lower extremity.  He ran out of Rasuvo about a month ago due to insurance issues.  He has not noticed any worsening in his symptoms.  He continues to have some discomfort and swelling in his left ankle.  He denies any episodes of Achilles tendinitis, Planter fasciitis or uveitis.  He states that he has been taking Cosentyx 150 mg subcu every 2 weeks without any interruption in the treatment.  He ran out of clobetasol topical agent.  He has not refilled his Cosyntex prescription since July.  Rasuvo was filled in June.  On asking he mentioned that he has not been taking Cosentyx every month and sometimes he is delayed by 3 weeks.  Activities of Daily Living:  Patient reports morning stiffness for 0 minutes.   Patient Denies nocturnal pain.  Difficulty dressing/grooming: Denies Difficulty climbing stairs: Denies Difficulty getting out of chair: Denies Difficulty using hands for taps, buttons, cutlery, and/or writing: Denies  Review of Systems  Constitutional:  Negative for fatigue.  HENT:  Negative for mouth sores and mouth dryness.   Eyes:  Positive for dryness.  Respiratory:  Negative for shortness of breath.   Cardiovascular:  Negative for chest pain and palpitations.  Gastrointestinal:  Negative for blood in stool, constipation and diarrhea.  Endocrine: Negative for increased urination.  Genitourinary:  Negative for involuntary urination.  Musculoskeletal:  Positive for joint pain, joint pain, joint swelling, myalgias and  myalgias. Negative for gait problem, muscle weakness, morning stiffness and muscle tenderness.  Skin:  Positive for rash. Negative for color change, hair loss and sensitivity to sunlight.  Allergic/Immunologic: Negative for susceptible to infections.  Neurological:  Negative for dizziness and headaches.  Hematological:  Negative for swollen glands.  Psychiatric/Behavioral:  Positive for depressed mood. Negative for sleep disturbance. The patient is nervous/anxious.     PMFS History:  Patient Active Problem List   Diagnosis Date Noted   High risk medication use 12/10/2015   Low back pain 12/10/2015   Obesity 12/10/2015   Psoriasis 12/09/2015   Psoriatic arthritis (HCC) 12/09/2015    Past Medical History:  Diagnosis Date   Arthritis    Hypertension    Psoriasis 12/09/2015   Psoriatic arthritis (HCC) 12/09/2015   Failed Enbrel    Family History  Problem Relation Age of Onset   Epilepsy Mother    Cancer Father        prostate    Healthy Daughter    Past Surgical History:  Procedure Laterality Date   ADENOIDECTOMY     Social History   Social History Narrative   Not on file   Immunization History  Administered Date(s) Administered   Influenza Inj Mdck Quad Pf 11/07/2016, 11/03/2017   Influenza, High Dose Seasonal PF 11/07/2016, 11/03/2017   Influenza,inj,Quad PF,6+ Mos 11/03/2018   Influenza-Unspecified 11/07/2016, 11/01/2019   PFIZER(Purple Top)SARS-COV-2 Vaccination 05/17/2019, 06/12/2019, 11/01/2019  Objective: Vital Signs: BP (!) 152/89 (BP Location: Left Arm, Patient Position: Sitting, Cuff Size: Normal)   Pulse (!) 51   Resp 17   Ht 5\' 10"  (1.778 m)   Wt 217 lb 12.8 oz (98.8 kg)   BMI 31.25 kg/m    Physical Exam Vitals and nursing note reviewed.  Constitutional:      Appearance: He is well-developed.  HENT:     Head: Normocephalic and atraumatic.  Eyes:     Conjunctiva/sclera: Conjunctivae normal.     Pupils: Pupils are equal, round, and reactive to  light.  Cardiovascular:     Rate and Rhythm: Normal rate and regular rhythm.     Heart sounds: Normal heart sounds.  Pulmonary:     Effort: Pulmonary effort is normal.     Breath sounds: Normal breath sounds.  Abdominal:     General: Bowel sounds are normal.     Palpations: Abdomen is soft.  Musculoskeletal:     Cervical back: Normal range of motion and neck supple.  Skin:    General: Skin is warm and dry.     Capillary Refill: Capillary refill takes less than 2 seconds.     Comments: Psoriasis plaques were noted over bilateral shins and bilateral elbows.  Neurological:     Mental Status: He is alert and oriented to person, place, and time.  Psychiatric:        Behavior: Behavior normal.      Musculoskeletal Exam: C-spine thoracic and lumbar spine were in good range of motion.  He had no SI joint tenderness.  Shoulder joints, elbow joints, wrist joints with good range of motion.  Telescoping of bilateral fourth and fifth fingers due to psoriatic arthritis was noted.  Hip joints and knee joints with good range of motion.  He had warmth and swelling on palpation over his left ankle joint.  There was no Achilles tendinitis or planter fasciitis.  CDAI Exam: CDAI Score: -- Patient Global: --; Provider Global: -- Swollen: --; Tender: -- Joint Exam 10/28/2021   No joint exam has been documented for this visit   There is currently no information documented on the homunculus. Go to the Rheumatology activity and complete the homunculus joint exam.  Investigation: No additional findings.  Imaging: No results found.  Recent Labs: Lab Results  Component Value Date   WBC 7.2 05/28/2021   HGB 14.5 05/28/2021   PLT 214 05/28/2021   NA 140 05/28/2021   K 4.3 05/28/2021   CL 105 05/28/2021   CO2 29 05/28/2021   GLUCOSE 90 05/28/2021   BUN 12 05/28/2021   CREATININE 1.03 05/28/2021   BILITOT 0.4 05/28/2021   ALKPHOS 84 02/12/2019   AST 16 05/28/2021   ALT 17 05/28/2021   PROT  6.8 05/28/2021   ALBUMIN 4.2 02/12/2019   CALCIUM 9.3 05/28/2021   GFRAA 91 07/22/2020   QFTBGOLDPLUS NEGATIVE 03/04/2021    Speciality Comments: Inadequate response to Enbrel and Humira  Procedures:  No procedures performed Allergies: Patient has no known allergies.   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  He continues to have pain and swelling in his left ankle joint.  He states that he has been taking Cosentyx 150 mg subcu every 2 weeks however sometimes his Cosentyx dosing is delayed by about 3 weeks.  His last refill  of Cosentyx was in July.  The last refill of Rasuvo was in June.  He probably had An interaction in both medications.  We had a detailed discussion about taking both medications on a regular basis.  We will see how he does over the next 3 months if he takes his medications on a regular basis.  I also discussed about switching him to Altamease Oiler if he has an inadequate response to the combination therapy after taking it on a regular basis.  He had no SI joint and tenderness, uveitis, Achilles tendinitis or planter fasciitis today.  Psoriasis-he is having a flare of psoriasis.  Psoriasis patches were noted over bilateral shins and also over the elbows.  He is out of clobetasol cream.  Prescription refill for clobetasol will be sent today.  High risk medication use - Cosentyx 150 mg sq injections every 2 weeks, Rasuvo 20mg  sq q wk, and folic acid 2 mg po qd. (Failed Enbrel & Humira).  Labs obtained on May 28, 2021 were reviewed.  CBC and CMP were normal.  TB gold was negative on March 04, 2021.  Need for getting labs every 3 months was emphasized.  We will get labs today.- Plan: CBC with Differential/Platelet, COMPLETE METABOLIC PANEL WITH GFR.  Information regarding vaccination was also placed in the AVS.  He  was advised to hold Cosyntex and Rasuvo if he develops an infection and resume after the infection resolves.  Sacroiliitis (HCC)-he had no tenderness on palpation today.  Left ankle swelling-he had warmth and swelling over left ankle.  There was no evidence of Planter fasciitis or Achilles tendinitis.  History of obesity-BMI is 31.25.  Regular exercise and dietary modifications were discussed.  History of hypertension-his blood pressure is elevated today.  He was advised to monitor blood pressure closely and follow-up with his PCP.  Increased risk of heart disease with psoriatic arthritis was discussed.  Orders: Orders Placed This Encounter  Procedures   CBC with Differential/Platelet   COMPLETE METABOLIC PANEL WITH GFR   Meds ordered this encounter  Medications   clobetasol cream (TEMOVATE) 0.05 %    Sig: Apply 1 Application topically 2 (two) times daily.    Dispense:  45 g    Refill:  1   Methotrexate, PF, (RASUVO) 20 MG/0.4ML SOAJ    Sig: INJECT ONE PEN SUBCUTANEOUSLY ONCE EVERY WEEK. STORE AT ROOM TEMPERATURE BETWEEN 68 - 77 DEGREES F.    Dispense:  4.8 mL    Refill:  0   Secukinumab, 300 MG Dose, (COSENTYX SENSOREADY, 300 MG,) 150 MG/ML SOAJ    Sig: INJECT TWO PENS SUBCUTANEOUSLY EVERY 4 WEEKS. REFRIGERATE. ALLOW 15 TO 30 MINUTES AT ROOM TEMP PRIOR TO ADMINISTRATION.    Dispense:  6 mL    Refill:  0     Follow-Up Instructions: Return in about 3 months (around 01/27/2022) for Psoriatic arthritis.   01/29/2022, MD  Note - This record has been created using Pollyann Savoy.  Chart creation errors have been sought, but may not always  have been located. Such creation errors do not reflect on  the standard of medical care.

## 2021-10-28 ENCOUNTER — Other Ambulatory Visit (HOSPITAL_COMMUNITY): Payer: Self-pay

## 2021-10-28 ENCOUNTER — Ambulatory Visit: Payer: BC Managed Care – PPO | Attending: Rheumatology | Admitting: Rheumatology

## 2021-10-28 ENCOUNTER — Encounter: Payer: Self-pay | Admitting: Rheumatology

## 2021-10-28 VITALS — BP 152/89 | HR 51 | Resp 17 | Ht 70.0 in | Wt 217.8 lb

## 2021-10-28 DIAGNOSIS — M25472 Effusion, left ankle: Secondary | ICD-10-CM

## 2021-10-28 DIAGNOSIS — Z79899 Other long term (current) drug therapy: Secondary | ICD-10-CM | POA: Diagnosis not present

## 2021-10-28 DIAGNOSIS — L409 Psoriasis, unspecified: Secondary | ICD-10-CM

## 2021-10-28 DIAGNOSIS — L4050 Arthropathic psoriasis, unspecified: Secondary | ICD-10-CM

## 2021-10-28 DIAGNOSIS — Z8679 Personal history of other diseases of the circulatory system: Secondary | ICD-10-CM

## 2021-10-28 DIAGNOSIS — M461 Sacroiliitis, not elsewhere classified: Secondary | ICD-10-CM

## 2021-10-28 DIAGNOSIS — Z8639 Personal history of other endocrine, nutritional and metabolic disease: Secondary | ICD-10-CM

## 2021-10-28 MED ORDER — RASUVO 20 MG/0.4ML ~~LOC~~ SOAJ: SUBCUTANEOUS | 0 refills | Status: DC

## 2021-10-28 MED ORDER — COSENTYX SENSOREADY (300 MG) 150 MG/ML ~~LOC~~ SOAJ: SUBCUTANEOUS | 0 refills | Status: DC

## 2021-10-28 MED ORDER — CLOBETASOL PROPIONATE 0.05 % EX CREA: 1.0000 | TOPICAL_CREAM | Freq: Two times a day (BID) | CUTANEOUS | 1 refills | Status: DC

## 2021-10-28 NOTE — Patient Instructions (Addendum)
Please call the pharmacy to provide your RASUVO copay card information and schedule shipment.  We'd like to see how well you are controlled when you take both RASUVO and COSENTYX as prescribed. We can then consider changing your medication at your follow-up visit once we have a more accurate assessment.  Please set a reminder in your phone [place] your Cosentyx dose is DUE so the pharmacy can get it your house on time.   Standing Labs We placed an order today for your standing lab work.   Please have your standing labs drawn in December and every 3 months  If possible, please have your labs drawn 2 weeks prior to your appointment so that the provider can discuss your results at your appointment.  Please note that you may see your imaging and lab results in [place] before we have reviewed them. We may be awaiting multiple results to interpret others before contacting you. Please allow our office up to 72 hours to thoroughly review all of the results before contacting the office for clarification of your results.  We currently have open lab daily: Monday through Thursday from 1:30 PM-4:30 PM and Friday from 1:30 PM- 4:00 PM If possible, please come for your lab work on Monday, Thursday or Friday afternoons, as you may experience shorter wait times.   Effective December 08, 2021 the new lab hours will change to: Monday through Thursday from 1:30 PM-5:00 PM and Friday from 8:30 AM-12:00 PM If possible, please come for your lab work on Monday and Thursday afternoons, as you may experience shorter wait times.  Please be advised, all patients with office appointments requiring lab work will take precedent over walk-in lab work.    The office is located at [address], [place], [place], [place] [address] No appointment is necessary.   Labs are drawn by [facility]. Please bring your co-pay at the time of your lab draw.  You may receive a bill from [facility] [place] for your lab work.  Please note  if you are on Hydroxychloroquine and and an order has been placed for a Hydroxychloroquine level, you will need to have it drawn 4 hours or more after your last dose.  If you wish to have your labs drawn at another location, please call the office 24 hours in advance to send orders.  If you have any questions regarding directions or hours of operation,  please call [phone].   As a reminder, please drink plenty of water prior to coming for your lab work. Thanks!   Vaccines You are taking a medication(s) that can suppress your immune system.  The following immunizations are recommended: Flu annually Covid-19  Td/Tdap (tetanus, diphtheria, pertussis) every 10 years Pneumonia (Prevnar 15 then Pneumovax 23 at least 1 year apart.  Alternatively, can take Prevnar 20 without needing additional dose) Shingrix: 2 doses from 4 weeks to 6 months apart  Please check with your PCP to make sure you are up to date.   If you have signs or symptoms of an infection or start antibiotics: First, call your PCP for workup of your infection. Hold your medication through the infection, until you complete your antibiotics, and until symptoms resolve if you take the following: Injectable medication (Actemra, Benlysta, Cimzia, Cosentyx, Enbrel, Humira, Kevzara, Orencia, Remicade, Simponi, Stelara, Taltz, Tremfya) Methotrexate Leflunomide (Arava) Mycophenolate (Cellcept) Morrie Sheldon, Olumiant, or Rinvoq  Heart Disease Prevention   Your inflammatory disease increases your risk of heart disease which includes heart attack, stroke, atrial fibrillation (irregular  heartbeats), high blood pressure, heart failure and atherosclerosis (plaque in the arteries).  It is important to reduce your risk by:   Keep blood pressure, cholesterol, and blood sugar at healthy levels   Smoking Cessation   Maintain a healthy weight  BMI 20-25   Eat a healthy diet  Plenty of fresh fruit, vegetables, and whole grains  Limit  saturated fats, foods high in sodium, and added sugars  DASH and Mediterranean diet   Increase physical activity  Recommend moderate physically activity for 150 minutes per week/ 30 minutes a day for five days a week These can be broken up into three separate ten-minute sessions during the day.   Reduce Stress  Meditation, slow breathing exercises, yoga, coloring books  Dental visits twice a year

## 2021-10-29 LAB — COMPLETE METABOLIC PANEL WITH GFR
AG Ratio: 1.6 (calc) (ref 1.0–2.5)
ALT: 14 U/L (ref 9–46)
AST: 15 U/L (ref 10–35)
Albumin: 4.2 g/dL (ref 3.6–5.1)
Alkaline phosphatase (APISO): 72 U/L (ref 35–144)
BUN: 10 mg/dL (ref 7–25)
CO2: 27 mmol/L (ref 20–32)
Calcium: 9.3 mg/dL (ref 8.6–10.3)
Chloride: 105 mmol/L (ref 98–110)
Creat: 0.95 mg/dL (ref 0.70–1.30)
Globulin: 2.7 g/dL (calc) (ref 1.9–3.7)
Glucose, Bld: 87 mg/dL (ref 65–99)
Potassium: 4.2 mmol/L (ref 3.5–5.3)
Sodium: 141 mmol/L (ref 135–146)
Total Bilirubin: 0.5 mg/dL (ref 0.2–1.2)
Total Protein: 6.9 g/dL (ref 6.1–8.1)
eGFR: 95 mL/min/{1.73_m2} (ref >=60)

## 2021-10-29 LAB — CBC WITH DIFFERENTIAL/PLATELET
Absolute Monocytes: 832 cells/uL (ref 200–950)
Basophils Absolute: 23 cells/uL (ref 0–200)
Basophils Relative: 0.3 %
Eosinophils Absolute: 108 cells/uL (ref 15–500)
Eosinophils Relative: 1.4 %
HCT: 42 % (ref 38.5–50.0)
Hemoglobin: 14.6 g/dL (ref 13.2–17.1)
Lymphs Abs: 2541 cells/uL (ref 850–3900)
MCH: 31.5 pg (ref 27.0–33.0)
MCHC: 34.8 g/dL (ref 32.0–36.0)
MCV: 90.7 fL (ref 80.0–100.0)
MPV: 11.9 fL (ref 7.5–12.5)
Monocytes Relative: 10.8 %
Neutro Abs: 4197 cells/uL (ref 1500–7800)
Neutrophils Relative %: 54.5 %
Platelets: 233 10*3/uL (ref 140–400)
RBC: 4.63 10*6/uL (ref 4.20–5.80)
RDW: 12.7 % (ref 11.0–15.0)
Total Lymphocyte: 33 %
WBC: 7.7 10*3/uL (ref 3.8–10.8)

## 2021-10-29 NOTE — Progress Notes (Signed)
CBC and CMP are normal.

## 2021-11-10 ENCOUNTER — Other Ambulatory Visit: Payer: Self-pay | Admitting: Physician Assistant

## 2022-01-14 NOTE — Progress Notes (Unsigned)
Office Visit Note  Patient: Michael Peck             Date of Birth: 1965-02-20           MRN: [id number]             PCP: Jerl Mina, MD Referring: Jerl Mina, MD Visit Date: 01/27/2022 Occupation: @GUAROCC @  Subjective:  Left knee pain  History of Present Illness: Michael Peck is a 56 y.o. male with history of psoriatic arthritis.  He remains on Cosentyx 150 mg sq injections every 2 weeks, Rasuvo 20mg  sq q wk, and folic acid 2 mg po qd. He states he occasionally has gaps in therapy at which time he experiences flares of psoriasis as well as intermittent arthralgias and joint inflammation.  His last dose of Cosentyx was administered last week.  He is experiencing increased discomfort below his left knee.  He denies any injury or fall.  He denies any mechanical symptoms.  He has not had any left knee joint swelling or warmth.  His symptoms have been going on intermittently for a while but have become more consistent recently.  He has not tried any OTC products or topical agents yet.  He denies any Achilles tendinitis or plantar fasciitis.  He has occasional discomfort in his SI joints but none currently. He denies any recent or recurrent infections.  He denies any new medical conditions.      Activities of Daily Living:  Patient reports morning stiffness for a few minutes.   Patient Denies nocturnal pain.  Difficulty dressing/grooming: Denies Difficulty climbing stairs: Denies Difficulty getting out of chair: Denies Difficulty using hands for taps, buttons, cutlery, and/or writing: Denies  Review of Systems  Constitutional:  Negative for fatigue.  HENT:  Negative for mouth sores and mouth dryness.   Eyes:  Positive for dryness.  Respiratory:  Negative for shortness of breath.   Cardiovascular:  Negative for chest pain and palpitations.  Gastrointestinal:  Negative for blood in stool, constipation and diarrhea.  Endocrine: Negative for increased urination.  Genitourinary:   Negative for involuntary urination.  Musculoskeletal:  Positive for joint pain, joint pain and morning stiffness. Negative for gait problem, joint swelling, myalgias, muscle weakness, muscle tenderness and myalgias.  Skin:  Positive for rash. Negative for color change, hair loss and sensitivity to sunlight.  Allergic/Immunologic: Negative for susceptible to infections.  Neurological:  Negative for dizziness and headaches.  Hematological:  Negative for swollen glands.  Psychiatric/Behavioral:  Negative for depressed mood and sleep disturbance. The patient is not nervous/anxious.     PMFS History:  Patient Active Problem List   Diagnosis Date Noted   High risk medication use 12/10/2015   Low back pain 12/10/2015   Obesity 12/10/2015   Psoriasis 12/09/2015   Psoriatic arthritis (HCC) 12/09/2015    Past Medical History:  Diagnosis Date   Arthritis    Hypertension    Psoriasis 12/09/2015   Psoriatic arthritis (HCC) 12/09/2015   Failed Enbrel    Family History  Problem Relation Age of Onset   Epilepsy Mother    Cancer Father        prostate    Healthy Daughter    Past Surgical History:  Procedure Laterality Date   ADENOIDECTOMY     Social History   Social History Narrative   Not on file   Immunization History  Administered Date(s) Administered   Influenza Inj Mdck Quad Pf 11/07/2016, 11/03/2017   Influenza, High Dose Seasonal PF 11/07/2016, 11/03/2017  Influenza,inj,Quad PF,6+ Mos 11/03/2018   Influenza-Unspecified 11/07/2016, 11/01/2019   PFIZER(Purple Top)SARS-COV-2 Vaccination 05/17/2019, 06/12/2019, 11/01/2019     Objective: Vital Signs: BP 135/85 (BP Location: Left Arm, Patient Position: Sitting, Cuff Size: Normal)   Pulse 61   Resp 17   Ht 5\' 10"  (1.778 m)   Wt 221 lb (100.2 kg)   BMI 31.71 kg/m    Physical Exam Vitals and nursing note reviewed.  Constitutional:      Appearance: He is well-developed.  HENT:     Head: Normocephalic and atraumatic.   Eyes:     Conjunctiva/sclera: Conjunctivae normal.     Pupils: Pupils are equal, round, and reactive to light.  Cardiovascular:     Rate and Rhythm: Normal rate and regular rhythm.     Heart sounds: Normal heart sounds.  Pulmonary:     Effort: Pulmonary effort is normal.     Breath sounds: Normal breath sounds.  Abdominal:     General: Bowel sounds are normal.     Palpations: Abdomen is soft.  Musculoskeletal:     Cervical back: Normal range of motion and neck supple.  Skin:    General: Skin is warm and dry.     Capillary Refill: Capillary refill takes less than 2 seconds.     Comments: Patches of plaque psoriasis on extensor surface of both elbows and anterior aspect of both shins.   Fingernail pitting noted.  Neurological:     Mental Status: He is alert and oriented to person, place, and time.  Psychiatric:        Behavior: Behavior normal.      Musculoskeletal Exam: C-spine, thoracic spine, lumbar spine have good range of motion.  No midline spinal tenderness or SI joint tenderness at this time.  Shoulder joints, elbow joints, and wrist joints have good range of motion.  Telescoping of bilateral fourth and fifth fingers due to psoriatic arthritis.  PIP and DIP thickening noted.  Hip joints have good range of motion with no groin pain.  Knee joints have good range of motion with no warmth or effusion.  Tenderness at the distal insertion site of the left patellar tendon.  Ankle joints have good range of motion with no discomfort or synovitis.  No evidence of Achilles tendinitis or plantar fasciitis.  No dactylitis noted in his feet.  CDAI Exam: CDAI Score: -- Patient Global: --; Provider Global: -- Swollen: --; Tender: -- Joint Exam 01/27/2022   No joint exam has been documented for this visit   There is currently no information documented on the homunculus. Go to the Rheumatology activity and complete the homunculus joint exam.  Investigation: No additional  findings.  Imaging: No results found.  Recent Labs: Lab Results  Component Value Date   WBC 7.7 10/28/2021   HGB 14.6 10/28/2021   PLT 233 10/28/2021   NA 141 10/28/2021   K 4.2 10/28/2021   CL 105 10/28/2021   CO2 27 10/28/2021   GLUCOSE 87 10/28/2021   BUN 10 10/28/2021   CREATININE 0.95 10/28/2021   BILITOT 0.5 10/28/2021   ALKPHOS 84 02/12/2019   AST 15 10/28/2021   ALT 14 10/28/2021   PROT 6.9 10/28/2021   ALBUMIN 4.2 02/12/2019   CALCIUM 9.3 10/28/2021   GFRAA 91 07/22/2020   QFTBGOLDPLUS NEGATIVE 03/04/2021    Speciality Comments: Inadequate response to Enbrel and Humira  Procedures:  No procedures performed Allergies: Patient has no known allergies.     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  No warmth or effusion noted in the left knee joint.  He experiences intermittent pain and swelling in the left ankle joint which improves if he takes Cosentyx and Rasuvo consistently.  He currenlty has plaque psoriasis on the extensor surface of both elbows and the anterior surface of both shins.  He often times has gaps in therapy/delayed dosing with Cosentyx and Rasuvo.  Discussed the importance of remaining on Cosentyx 150 mg sq injections every 2 weeks and Rasuvo 20 mg sq injections once weekly.  Discussed that if his psoriasis persists or he continues to have recurrent flares despite being on therapy as prescribed we can discuss switching from Cosentyx to [place] in the future.  He will notify us if he develops any new or worsening symptoms.  He will follow-up in the office in 5 months or sooner if needed.  Psoriasis: He has large patches of plaque psoriasis on the anterior aspect of both hands as well as small patches of psoriasis on the  extensor surface of both elbows.  His psoriasis typically flares if he has gaps in therapy.  According to the patient if he takes Cosentyx and Rasuvo as prescribed to psoriasis will clear.  Discussed that if he starts to have breakthrough flares despite remaining on therapy as prescribed we can discuss switching from Cosentyx to [place] in the future.  Discussed that he would be a good candidate for the use of Vtama in the future.    High risk medication use - Cosentyx 150 mg sq injections every 2 weeks, Rasuvo 20mg  sq q wk, and folic acid 2 mg po qd. (Failed Enbrel & Humira).  CBC and CMP drawn on 10/28/21.  Orders for CBC and CMP released today.  His next lab work will be due in March and every 3 months to monitor for drug toxicity.   TB gold negative on 03/04/21.  Discussed the importance of holding Cosentyx and Rasuvo if he develops signs or symptoms of an infection and to resume once the infection has completely cleared. - Plan: COMPLETE METABOLIC PANEL WITH GFR, CBC with Differential/Platelet, QuantiFERON-TB Gold Plus  Sacroiliitis [facility]): He has intermittent SI joint pain bilaterally.  No tenderness over the SI joints noted on examination today.  He is not currently experiencing any nocturnal pain or morning stiffness.  Other medical conditions are listed as follows:   History of obesity  History of hypertension: BP 135/85 today in the office.    Orders: Orders Placed This Encounter  Procedures   COMPLETE METABOLIC PANEL WITH GFR   CBC with Differential/Platelet   QuantiFERON-TB Gold Plus   No orders of the defined types were placed in this encounter.   Follow-Up Instructions: Return in about 5 months (around 06/28/2022) for Psoriatic arthritis.   06/30/2022, PA-C  Note - This record has been created using Dragon software.  Chart creation errors have been sought, but may not always  have been located. Such creation errors do not reflect on  the standard of medical care.

## 2022-01-27 ENCOUNTER — Ambulatory Visit: Payer: BC Managed Care – PPO | Attending: Physician Assistant | Admitting: Physician Assistant

## 2022-01-27 ENCOUNTER — Encounter: Payer: Self-pay | Admitting: Physician Assistant

## 2022-01-27 VITALS — BP 135/85 | HR 61 | Resp 17 | Ht 70.0 in | Wt 221.0 lb

## 2022-01-27 DIAGNOSIS — L409 Psoriasis, unspecified: Secondary | ICD-10-CM

## 2022-01-27 DIAGNOSIS — Z8639 Personal history of other endocrine, nutritional and metabolic disease: Secondary | ICD-10-CM

## 2022-01-27 DIAGNOSIS — L4050 Arthropathic psoriasis, unspecified: Secondary | ICD-10-CM

## 2022-01-27 DIAGNOSIS — Z8679 Personal history of other diseases of the circulatory system: Secondary | ICD-10-CM

## 2022-01-27 DIAGNOSIS — Z79899 Other long term (current) drug therapy: Secondary | ICD-10-CM

## 2022-01-27 DIAGNOSIS — M461 Sacroiliitis, not elsewhere classified: Secondary | ICD-10-CM

## 2022-01-27 NOTE — Progress Notes (Signed)
CBC WNL

## 2022-01-27 NOTE — Patient Instructions (Addendum)
  Voltaren gel  Vtama      Standing Labs We placed an order today for your standing lab work.   Please have your standing labs drawn in March and every 3 months    TB gold due at end of January 2024    Please have your labs drawn 2 weeks prior to your appointment so that the provider can discuss your lab results at your appointment.  Please note that you may see your imaging and lab results in MyChart before we have reviewed them. We will contact you once all results are reviewed. Please allow our office up to 72 hours to thoroughly review all of the results before contacting the office for clarification of your results.  Lab hours are:   Monday through Thursday from 8:00 am -12:30 pm and 1:00 pm-5:00 pm and Friday from 8:00 am-12:00 pm.  Please be advised, all patients with office appointments requiring lab work will take precedent over walk-in lab work.   Labs are drawn by [facility]. Please bring your co-pay at the time of your lab draw.  You may receive a bill from [facility] for your lab work.  Please note if you are on Hydroxychloroquine and and an order has been placed for a Hydroxychloroquine level, you will need to have it drawn 4 hours or more after your last dose.  If you wish to have your labs drawn at another location, please call the office 24 hours in advance so we can fax the orders.  The office is located at [address], [place], [place] [address] No appointment is necessary.    If you have any questions regarding directions or hours of operation,  please call [phone].   As a reminder, please drink plenty of water prior to coming for your lab work. Thanks!  If you have signs or symptoms of an infection or start antibiotics: First, call your PCP for workup of your infection. Hold your medication through the infection, until you complete your antibiotics, and until symptoms resolve if you take the following: Injectable medication (Actemra, Benlysta,  Cimzia, Cosentyx, Enbrel, Humira, Kevzara, Orencia, Remicade, Simponi, Stelara, Taltz, Tremfya) Methotrexate Leflunomide (Arava) Mycophenolate (Cellcept) Harriette Ohara, Olumiant, or Rinvoq  Vaccines You are taking a medication(s) that can suppress your immune system.  The following immunizations are recommended: Flu annually Covid-19  Td/Tdap (tetanus, diphtheria, pertussis) every 10 years Pneumonia (Prevnar 15 then Pneumovax 23 at least 1 year apart.  Alternatively, can take Prevnar 20 without needing additional dose) Shingrix: 2 doses from 4 weeks to 6 months apart  Please check with your PCP to make sure you are up to date.

## 2022-01-28 LAB — COMPLETE METABOLIC PANEL WITH GFR
AG Ratio: 1.5 (calc) (ref 1.0–2.5)
ALT: 18 U/L (ref 9–46)
AST: 15 U/L (ref 10–35)
Albumin: 4.1 g/dL (ref 3.6–5.1)
Alkaline phosphatase (APISO): 68 U/L (ref 35–144)
BUN: 14 mg/dL (ref 7–25)
CO2: 28 mmol/L (ref 20–32)
Calcium: 9.4 mg/dL (ref 8.6–10.3)
Chloride: 103 mmol/L (ref 98–110)
Creat: 0.95 mg/dL (ref 0.70–1.30)
Globulin: 2.7 g/dL (calc) (ref 1.9–3.7)
Glucose, Bld: 101 mg/dL — ABNORMAL HIGH (ref 65–99)
Potassium: 4.5 mmol/L (ref 3.5–5.3)
Sodium: 138 mmol/L (ref 135–146)
Total Bilirubin: 0.4 mg/dL (ref 0.2–1.2)
Total Protein: 6.8 g/dL (ref 6.1–8.1)
eGFR: 95 mL/min/{1.73_m2} (ref >=60)

## 2022-01-28 LAB — CBC WITH DIFFERENTIAL/PLATELET
Absolute Monocytes: 632 cells/uL (ref 200–950)
Basophils Absolute: 27 cells/uL (ref 0–200)
Basophils Relative: 0.4 %
Eosinophils Absolute: 109 cells/uL (ref 15–500)
Eosinophils Relative: 1.6 %
HCT: 41.5 % (ref 38.5–50.0)
Hemoglobin: 14.4 g/dL (ref 13.2–17.1)
Lymphs Abs: 2162 cells/uL (ref 850–3900)
MCH: 31.9 pg (ref 27.0–33.0)
MCHC: 34.7 g/dL (ref 32.0–36.0)
MCV: 91.8 fL (ref 80.0–100.0)
MPV: 11.9 fL (ref 7.5–12.5)
Monocytes Relative: 9.3 %
Neutro Abs: 3869 cells/uL (ref 1500–7800)
Neutrophils Relative %: 56.9 %
Platelets: 234 10*3/uL (ref 140–400)
RBC: 4.52 10*6/uL (ref 4.20–5.80)
RDW: 13.3 % (ref 11.0–15.0)
Total Lymphocyte: 31.8 %
WBC: 6.8 10*3/uL (ref 3.8–10.8)

## 2022-01-28 NOTE — Progress Notes (Signed)
CMP WNL

## 2022-03-03 ENCOUNTER — Other Ambulatory Visit: Payer: Self-pay | Admitting: Physician Assistant

## 2022-03-03 NOTE — Telephone Encounter (Signed)
Next Visit: 07/05/2022  Last Visit: 01/27/2022  Last Fill: 12/07/2020  DX: Psoriatic arthritis   Current Dose per office note on 35/59/7416: folic acid 2 mg po qd.   Okay to refill folic acid?

## 2022-03-08 ENCOUNTER — Other Ambulatory Visit: Payer: Self-pay | Admitting: Rheumatology

## 2022-03-08 NOTE — Telephone Encounter (Signed)
Next Visit: 07/05/2022  Last Visit: 01/27/2022  Last Fill: 10/28/2021  [id number] arthritis   Current Dose per office note on 01/27/2022: Cosentyx 150 mg sq injections every 2 weeks, Rasuvo 20mg  sq q wk   Labs: 01/27/2022 CBC WNL CMP WNL   TB Gold: 03/04/2021 negative    Okay to refill cosentyx and rasuvo?

## 2022-04-22 ENCOUNTER — Other Ambulatory Visit: Payer: Self-pay | Admitting: *Deleted

## 2022-04-22 DIAGNOSIS — Z79899 Other long term (current) drug therapy: Secondary | ICD-10-CM

## 2022-04-25 NOTE — Progress Notes (Signed)
CBC and CMP WNL

## 2022-04-26 LAB — CBC WITH DIFFERENTIAL/PLATELET
Absolute Monocytes: 536 cells/uL (ref 200–950)
Basophils Absolute: 19 cells/uL (ref 0–200)
Basophils Relative: 0.3 %
Eosinophils Absolute: 202 cells/uL (ref 15–500)
Eosinophils Relative: 3.2 %
HCT: 41 % (ref 38.5–50.0)
Hemoglobin: 14 g/dL (ref 13.2–17.1)
Lymphs Abs: 1632 cells/uL (ref 850–3900)
MCH: 31.2 pg (ref 27.0–33.0)
MCHC: 34.1 g/dL (ref 32.0–36.0)
MCV: 91.3 fL (ref 80.0–100.0)
MPV: 11.4 fL (ref 7.5–12.5)
Monocytes Relative: 8.5 %
Neutro Abs: 3912 cells/uL (ref 1500–7800)
Neutrophils Relative %: 62.1 %
Platelets: 260 10*3/uL (ref 140–400)
RBC: 4.49 10*6/uL (ref 4.20–5.80)
RDW: 13.1 % (ref 11.0–15.0)
Total Lymphocyte: 25.9 %
WBC: 6.3 10*3/uL (ref 3.8–10.8)

## 2022-04-26 LAB — COMPLETE METABOLIC PANEL WITH GFR
AG Ratio: 1.6 (calc) (ref 1.0–2.5)
ALT: 19 U/L (ref 9–46)
AST: 16 U/L (ref 10–35)
Albumin: 4.1 g/dL (ref 3.6–5.1)
Alkaline phosphatase (APISO): 69 U/L (ref 35–144)
BUN: 11 mg/dL (ref 7–25)
CO2: 27 mmol/L (ref 20–32)
Calcium: 9.1 mg/dL (ref 8.6–10.3)
Chloride: 106 mmol/L (ref 98–110)
Creat: 0.94 mg/dL (ref 0.70–1.30)
Globulin: 2.6 g/dL (calc) (ref 1.9–3.7)
Glucose, Bld: 100 mg/dL — ABNORMAL HIGH (ref 65–99)
Potassium: 4.3 mmol/L (ref 3.5–5.3)
Sodium: 142 mmol/L (ref 135–146)
Total Bilirubin: 0.5 mg/dL (ref 0.2–1.2)
Total Protein: 6.7 g/dL (ref 6.1–8.1)
eGFR: 96 mL/min/{1.73_m2} (ref >=60)

## 2022-04-26 LAB — QUANTIFERON-TB GOLD PLUS
Mitogen-NIL: 5.52 IU/mL
NIL: 0.03 IU/mL
QuantiFERON-TB Gold Plus: NEGATIVE
TB1-NIL: 0 IU/mL
TB2-NIL: 0 IU/mL

## 2022-04-26 NOTE — Progress Notes (Signed)
TB gold negative

## 2022-06-21 NOTE — Progress Notes (Signed)
Office Visit Note  Patient: Michael Peck             Date of Birth: [birth year]           MRN: [id number]             PCP: Jerl Mina, MD Referring: Jerl Mina, MD Visit Date: 07/05/2022 Occupation: @GUAROCC @  Subjective:  Right hand pain    History of Present Illness: Michael Peck is a 57 y.o. male with history of psoriatic arthritis.  Patient remains on Cosentyx 150 mg sq injections every 2 weeks, Rasuvo 20mg  sq q wk, and folic acid 2 mg po qd. he continues to tolerate Cosentyx and Rasuvo without any side effects or injection site reactions.  Patient reports that a few weeks ago he helped his son move and states he has had some persistent pain and swelling in his right hand.  He uses Voltaren gel topically as needed for pain relief.  He denies any increased SI joint pain at this time.  He has chronic swelling in the left ankle but denies any other joint pain or joint swelling.  He denies any Achilles tendinitis or plantar fasciitis.  He states he continues to have patches of psoriasis on his elbows and lower legs.  He denies any new patches.  He has not been using a betazole cream topically as needed recently.   Activities of Daily Living:  Patient reports morning stiffness for a few minutes.   Patient Reports nocturnal pain.  Difficulty dressing/grooming: Denies Difficulty climbing stairs: Denies Difficulty getting out of chair: Denies Difficulty using hands for taps, buttons, cutlery, and/or writing: Reports  Review of Systems  Constitutional:  Negative for fatigue.  HENT:  Negative for mouth sores and mouth dryness.   Eyes:  Negative for dryness.  Respiratory:  Negative for shortness of breath.   Cardiovascular:  Negative for chest pain and palpitations.  Gastrointestinal:  Negative for blood in stool, constipation and diarrhea.  Endocrine: Negative for increased urination.  Genitourinary:  Negative for involuntary urination.  Musculoskeletal:  Positive for joint pain,  joint pain, joint swelling and morning stiffness. Negative for gait problem, myalgias, muscle weakness, muscle tenderness and myalgias.  Skin:  Positive for rash. Negative for color change, hair loss and sensitivity to sunlight.  Allergic/Immunologic: Negative for susceptible to infections.  Neurological:  Negative for dizziness and headaches.  Hematological:  Negative for swollen glands.  Psychiatric/Behavioral:  Negative for depressed mood and sleep disturbance. The patient is not nervous/anxious.     PMFS History:  Patient Active Problem List   Diagnosis Date Noted   High risk medication use 12/10/2015   Low back pain 12/10/2015   Obesity 12/10/2015   Psoriasis 12/09/2015   Psoriatic arthritis (HCC) 12/09/2015    Past Medical History:  Diagnosis Date   Arthritis    Hypertension    Psoriasis 12/09/2015   Psoriatic arthritis (HCC) 12/09/2015   Failed Enbrel    Family History  Problem Relation Age of Onset   Epilepsy Mother    Cancer Father        prostate    Healthy Daughter    Past Surgical History:  Procedure Laterality Date   ADENOIDECTOMY     Social History   Social History Narrative   Not on file   Immunization History  Administered Date(s) Administered   Influenza Inj Mdck Quad Pf 11/07/2016, 11/03/2017   Influenza, High Dose Seasonal PF 11/07/2016, 11/03/2017   Influenza,inj,Quad PF,6+ Mos 11/03/2018   Influenza-Unspecified  11/07/2016, 11/01/2019   PFIZER(Purple Top)SARS-COV-2 Vaccination 05/17/2019, 06/12/2019, 11/01/2019     Objective: Vital Signs: BP 137/81 (BP Location: Left Arm, Patient Position: Sitting, Cuff Size: Normal)   Pulse 65   Resp 17   Ht 5\' 10"  (1.778 m)   Wt 226 lb 12.8 oz (102.9 kg)   BMI 32.54 kg/m    Physical Exam Vitals and nursing note reviewed.  Constitutional:      Appearance: He is well-developed.  HENT:     Head: Normocephalic and atraumatic.  Eyes:     Conjunctiva/sclera: Conjunctivae normal.     Pupils: Pupils are  equal, round, and reactive to light.  Cardiovascular:     Rate and Rhythm: Normal rate and regular rhythm.     Heart sounds: Normal heart sounds.  Pulmonary:     Effort: Pulmonary effort is normal.     Breath sounds: Normal breath sounds.  Abdominal:     General: Bowel sounds are normal.     Palpations: Abdomen is soft.  Musculoskeletal:     Cervical back: Normal range of motion and neck supple.  Skin:    General: Skin is warm and dry.     Capillary Refill: Capillary refill takes less than 2 seconds.  Neurological:     Mental Status: He is alert and oriented to person, place, and time.  Psychiatric:        Behavior: Behavior normal.      Musculoskeletal Exam: C-spine has slightly limited range of motion with lateral rotation.  No midline spinal tenderness.  No SI joint tenderness upon palpation.  Shoulder joints, elbow joints, wrist joints have good range of motion.  Telescoping of the bilateral fourth and fifth fingers.  PIP and DIP thickening.  Dactylitis noted in the right index finger.  Hip joints have good range of motion with no groin pain.  Knee joints have good range of motion with no warmth or effusion.  Tenderness and thickening noted in the left ankle.  No tenderness or synovitis over MTP joints.  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  No evidence of Achilles tendinitis or plantar fasciitis.  CDAI Exam: CDAI Score: -- Patient Global: --; Provider Global: -- Swollen: 3 ; Tender: 3  Joint Exam 07/05/2022      Right  Left  PIP 2  Swollen Tender     DIP 2  Swollen Tender     Ankle     Swollen Tender     Investigation: No additional findings.  Imaging: No results found.  Recent Labs: Lab Results  Component Value Date   WBC 6.3 04/22/2022   HGB 14.0 04/22/2022   PLT 260 04/22/2022   NA 142 04/22/2022   K 4.3 04/22/2022   CL 106 04/22/2022   CO2 27 04/22/2022   GLUCOSE 100 (H) 04/22/2022   BUN 11 04/22/2022   CREATININE 0.94  04/22/2022   BILITOT 0.5 04/22/2022   ALKPHOS 84 02/12/2019   AST 16 04/22/2022   ALT 19 04/22/2022   PROT 6.7 04/22/2022   ALBUMIN 4.2 02/12/2019   CALCIUM 9.1 04/22/2022   GFRAA 91 07/22/2020   QFTBGOLDPLUS NEGATIVE 04/22/2022    Speciality Comments: Inadequate response to Enbrel and Humira  Procedures:  No procedures performed Allergies: Patient has no known allergies.    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  Several weeks ago he helped to move his son which she feels exacerbated his joint pain and inflammation.  He has been using voltaren gel topically as needed for pain relief. No evidence of Achilles tendinitis or plantar fasciitis.  No SI joint tenderness upon palpation today.  Patches of plaque psoriasis on the extensor surface of both elbows and bilateral lower legs.  Patient remains on Cosentyx 150 mg sq injections every 2 weeks and Rasuvo 20 mg subcutaneous injections once weekly.  He is tolerating combination therapy without any side effects or injection site reactions.  He occasionally has gaps in therapy which she attributes to delays [facility].  He does not want to make any medication changes at this time.  He continues to find combination therapy to be effective at managing his symptoms.  He was advised to notify us if he starts to have more frequent flares.  He will follow up in 5 months or sooner if needed.    Psoriasis: Large patches of plaque psoriasis on lower legs.  Patches noted on extensor surface of both elbows.  He has a prescription for clobetasol cream which he uses as needed.  He does not need a refill at this time.  Sacroiliitis (HCC): No SI joint tenderness.    High risk medication use - Cosentyx 150 mg sq injections every 2 weeks,  Rasuvo 20mg  sq q wk, and folic acid 2 mg po qd. (Failed Enbrel & Humira). CBC and CMP updated on 04/22/22. Orders for CBC and CMP released today. Her next lab work will be due in September and every 3 months.  TB gold negative on 04/22/22.  No recent or recurrent infections.  Discussed the importance of holding cosentyx and rasuvo if he develops signs or symptoms of an infection and to resume once the infection has completely cleared.   - Plan: CBC with Differential/Platelet, COMPLETE METABOLIC PANEL WITH GFR  History of hypertension: BP was 137/81 today in the office.   Orders: Orders Placed This Encounter  Procedures   CBC with Differential/Platelet   COMPLETE METABOLIC PANEL WITH GFR   No orders of the defined types were placed in this encounter.   Follow-Up Instructions: Return in about 5 months (around 12/05/2022) for Psoriatic arthritis.   Gearldine Bienenstock, PA-C  Note - This record has been created using Dragon software.  Chart creation errors have been sought, but may not always  have been located. Such creation errors do not reflect on  the standard of medical care.

## 2022-07-05 ENCOUNTER — Encounter: Payer: Self-pay | Admitting: Physician Assistant

## 2022-07-05 ENCOUNTER — Ambulatory Visit: Payer: BC Managed Care – PPO | Attending: Physician Assistant | Admitting: Physician Assistant

## 2022-07-05 VITALS — BP 137/81 | HR 65 | Resp 17 | Ht 70.0 in | Wt 226.8 lb

## 2022-07-05 DIAGNOSIS — Z79899 Other long term (current) drug therapy: Secondary | ICD-10-CM | POA: Diagnosis not present

## 2022-07-05 DIAGNOSIS — M461 Sacroiliitis, not elsewhere classified: Secondary | ICD-10-CM

## 2022-07-05 DIAGNOSIS — Z8679 Personal history of other diseases of the circulatory system: Secondary | ICD-10-CM

## 2022-07-05 DIAGNOSIS — L4050 Arthropathic psoriasis, unspecified: Secondary | ICD-10-CM | POA: Diagnosis not present

## 2022-07-05 DIAGNOSIS — L409 Psoriasis, unspecified: Secondary | ICD-10-CM | POA: Diagnosis not present

## 2022-07-05 NOTE — Patient Instructions (Addendum)
Standing Labs We placed an order today for your standing lab work.   Please have your standing labs drawn in September and every 3 months   Please have your labs drawn 2 weeks prior to your appointment so that the provider can discuss your lab results at your appointment, if possible.  Please note that you may see your imaging and lab results in MyChart before we have reviewed them. We will contact you once all results are reviewed. Please allow our office up to 72 hours to thoroughly review all of the results before contacting the office for clarification of your results.  WALK-IN LAB HOURS  Monday through Thursday from 8:00 am -12:30 pm and 1:00 pm-5:00 pm and Friday from 8:00 am-12:00 pm.  Patients with office visits requiring labs will be seen before walk-in labs.  You may encounter longer than normal wait times. Please allow additional time. Wait times may be shorter on  Monday and Thursday afternoons.  We do not book appointments for walk-in labs. We appreciate your patience and understanding with our staff.   Labs are drawn by [facility]. Please bring your co-pay at the time of your lab draw.  You may receive a bill from [facility] for your lab work.  Please note if you are on Hydroxychloroquine and and an order has been placed for a Hydroxychloroquine level,  you will need to have it drawn 4 hours or more after your last dose.  If you wish to have your labs drawn at another location, please call the office 24 hours in advance so we can fax the orders.  The office is located at [address], [address], [place], [place] [address]   If you have any questions regarding directions or hours of operation,  please call [phone].   As a reminder, please drink plenty of water prior to coming for your lab work. Thanks!  

## 2022-07-05 NOTE — Progress Notes (Signed)
CBC WNL

## 2022-07-06 ENCOUNTER — Telehealth: Payer: Self-pay | Admitting: Pharmacist

## 2022-07-06 LAB — CBC WITH DIFFERENTIAL/PLATELET
Absolute Monocytes: 637 cells/uL (ref 200–950)
Basophils Absolute: 34 cells/uL (ref 0–200)
Basophils Relative: 0.5 %
Eosinophils Absolute: 94 cells/uL (ref 15–500)
Eosinophils Relative: 1.4 %
HCT: 40.5 % (ref 38.5–50.0)
Hemoglobin: 13.7 g/dL (ref 13.2–17.1)
Lymphs Abs: 1695 cells/uL (ref 850–3900)
MCH: 30.8 pg (ref 27.0–33.0)
MCHC: 33.8 g/dL (ref 32.0–36.0)
MCV: 91 fL (ref 80.0–100.0)
MPV: 12.2 fL (ref 7.5–12.5)
Monocytes Relative: 9.5 %
Neutro Abs: 4241 cells/uL (ref 1500–7800)
Neutrophils Relative %: 63.3 %
Platelets: 252 10*3/uL (ref 140–400)
RBC: 4.45 10*6/uL (ref 4.20–5.80)
RDW: 13.6 % (ref 11.0–15.0)
Total Lymphocyte: 25.3 %
WBC: 6.7 10*3/uL (ref 3.8–10.8)

## 2022-07-06 LAB — COMPLETE METABOLIC PANEL WITH GFR
AG Ratio: 1.6 (calc) (ref 1.0–2.5)
ALT: 25 U/L (ref 9–46)
AST: 19 U/L (ref 10–35)
Albumin: 4.2 g/dL (ref 3.6–5.1)
Alkaline phosphatase (APISO): 67 U/L (ref 35–144)
BUN: 15 mg/dL (ref 7–25)
CO2: 28 mmol/L (ref 20–32)
Calcium: 9.3 mg/dL (ref 8.6–10.3)
Chloride: 105 mmol/L (ref 98–110)
Creat: 0.96 mg/dL (ref 0.70–1.30)
Globulin: 2.6 g/dL (calc) (ref 1.9–3.7)
Glucose, Bld: 119 mg/dL — ABNORMAL HIGH (ref 65–99)
Potassium: 4.2 mmol/L (ref 3.5–5.3)
Sodium: 140 mmol/L (ref 135–146)
Total Bilirubin: 0.5 mg/dL (ref 0.2–1.2)
Total Protein: 6.8 g/dL (ref 6.1–8.1)
eGFR: 93 mL/min/{1.73_m2} (ref >=60)

## 2022-07-06 NOTE — Telephone Encounter (Signed)
Submitted a Prior Authorization renewal request to [facility] for COSENTYX SQ via CoverMyMeds. Will update once we receive a response.  Key: [id number]  Chesley Mires, PharmD, MPH, BCPS, CPP Clinical Pharmacist (Rheumatology and Pulmonology)

## 2022-07-06 NOTE — Telephone Encounter (Signed)
Received notification from [facility] regarding a prior authorization for COSENTYX. Authorization has been APPROVED from 07/06/22 to 07/06/23. Approval letter sent to scan center.  Patient must continue to fill through [facility]: [phone]  Authorization # [id number]  Chesley Mires, PharmD, MPH, BCPS, CPP Clinical Pharmacist (Rheumatology and Pulmonology)

## 2022-07-06 NOTE — Progress Notes (Signed)
Glucose is 119. Rest of CMP WNL.

## 2022-08-15 ENCOUNTER — Other Ambulatory Visit: Payer: Self-pay | Admitting: Physician Assistant

## 2022-08-15 NOTE — Telephone Encounter (Signed)
Last Fill: 03/08/2022   Labs: 07/05/2022 Glucose is 119. Rest of CMP WNL.  CBC WNL   TB Gold: 04/22/2022  TB gold negative   Next Visit: 12/05/2022  Last Visit: 07/05/2022  [id number] arthritis   Current Dose per office note 07/05/2022: Cosentyx 150 mg sq injections every 2 weeks, Rasuvo 20mg  sq q wk   Okay to refill Cosentyx and Rasuvo?

## 2022-08-19 ENCOUNTER — Telehealth: Payer: Self-pay | Admitting: Pharmacist

## 2022-08-19 NOTE — Telephone Encounter (Addendum)
Submitted a Prior Authorization renewal request to [facility] for RASUVO via CoverMyMeds.  Key: [id number]

## 2022-08-22 NOTE — Telephone Encounter (Signed)
Received notification from [facility] regarding a prior authorization for RASUVO. Authorization has been APPROVED from 08/21/22 to 08/21/23. Approval letter sent to scan center.  Patient must continue to fill through [facility]: [phone]  Authorization # [id number]  Chesley Mires, PharmD, MPH, BCPS, CPP Clinical Pharmacist (Rheumatology and Pulmonology)

## 2022-11-22 NOTE — Progress Notes (Signed)
Office Visit Note  Patient: Michael Peck             Date of Birth: 1965-04-27           MRN: [id number]             PCP: Jerl Mina, MD Referring: Jerl Mina, MD Visit Date: 12/05/2022 Occupation: @GUAROCC @  Subjective:  Medication management  History of Present Illness: Kentavion Wanamaker is a 57 y.o. male with psoriatic arthritis and psoriasis.  He has been on Cosentyx and Rasuvo combination.  Patient states he ran out of medication and missed 1 dose.  He states he has been taking Cosentyx and Rasuvo on a regular basis.  He continues to have psoriasis on his bilateral shins.  He also have some psoriasis on his right elbow.  He uses topical clobetasol cream which is not very effective.  He has intermittent joint discomfort but he has not had joint pain in a long time.  He denies any history of plantar fasciitis, Achilles tendinitis or uveitis.    Activities of Daily Living:  Patient reports morning stiffness for a few minutes.   Patient Denies nocturnal pain.  Difficulty dressing/grooming: Denies Difficulty climbing stairs: Denies Difficulty getting out of chair: Denies Difficulty using hands for taps, buttons, cutlery, and/or writing: Denies  Review of Systems  Constitutional:  Negative for fatigue.  HENT:  Negative for mouth sores and mouth dryness.   Eyes:  Positive for dryness. Negative for pain, redness and itching.  Respiratory:  Negative for shortness of breath.   Cardiovascular:  Negative for chest pain and palpitations.  Gastrointestinal:  Negative for blood in stool, constipation and diarrhea.  Endocrine: Negative for increased urination.  Genitourinary:  Negative for involuntary urination.  Musculoskeletal:  Positive for morning stiffness. Negative for joint pain, gait problem, joint pain, joint swelling, myalgias, muscle weakness, muscle tenderness and myalgias.  Skin:  Positive for rash. Negative for color change, hair loss and sensitivity to sunlight.   Allergic/Immunologic: Negative for susceptible to infections.  Neurological:  Negative for dizziness and headaches.  Hematological:  Negative for swollen glands.  Psychiatric/Behavioral:  Negative for depressed mood and sleep disturbance. The patient is not nervous/anxious.     PMFS History:  Patient Active Problem List   Diagnosis Date Noted   High risk medication use 12/10/2015   Low back pain 12/10/2015   Obesity 12/10/2015   Psoriasis 12/09/2015   Psoriatic arthritis (HCC) 12/09/2015    Past Medical History:  Diagnosis Date   Arthritis    Hypertension    Psoriasis 12/09/2015   Psoriatic arthritis (HCC) 12/09/2015   Failed Enbrel    Family History  Problem Relation Age of Onset   Epilepsy Mother    Cancer Father        prostate    Healthy Daughter    Past Surgical History:  Procedure Laterality Date   ADENOIDECTOMY     Social History   Social History Narrative   Not on file   Immunization History  Administered Date(s) Administered   Influenza Inj Mdck Quad Pf 11/07/2016, 11/03/2017   Influenza, High Dose Seasonal PF 11/07/2016, 11/03/2017   Influenza,inj,Quad PF,6+ Mos 11/03/2018   Influenza-Unspecified 11/07/2016, 11/01/2019   PFIZER(Purple Top)SARS-COV-2 Vaccination 05/17/2019, 06/12/2019, 11/01/2019     Objective: Vital Signs: BP 129/78 (BP Location: Left Arm, Patient Position: Sitting, Cuff Size: Normal)   Pulse 62   Resp 15   Ht 5\' 10"  (1.778 m)   Wt 223 lb (101.2 kg)  BMI 32.00 kg/m    Physical Exam Vitals and nursing note reviewed.  Constitutional:      Appearance: He is well-developed.  HENT:     Head: Normocephalic and atraumatic.  Eyes:     Conjunctiva/sclera: Conjunctivae normal.     Pupils: Pupils are equal, round, and reactive to light.  Cardiovascular:     Rate and Rhythm: Normal rate and regular rhythm.     Heart sounds: Normal heart sounds.  Pulmonary:     Effort: Pulmonary effort is normal.     Breath sounds: Normal breath  sounds.  Abdominal:     General: Bowel sounds are normal.     Palpations: Abdomen is soft.  Musculoskeletal:     Cervical back: Normal range of motion and neck supple.  Skin:    General: Skin is warm and dry.     Capillary Refill: Capillary refill takes less than 2 seconds.     Comments: Extensive psoriasis patches noted over bilateral shin area.  Few dry patches were noted over right elbow.  Neurological:     Mental Status: He is alert and oriented to person, place, and time.  Psychiatric:        Behavior: Behavior normal.      Musculoskeletal Exam: He had limited lateral rotation of the cervical spine without any discomfort.  There was no tenderness over thoracic or lumbar spine.  There was no SI joint tenderness.  Shoulders, elbows, wrist joints were in good range of motion with no synovitis.  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  No dactylitis was noted.  DIP and PIP thickening with no synovitis was noted.  Hip joints and knee joints were in good range of motion.  He had no tenderness over ankles or MTPs.  No Achilles tendinitis or plantar fasciitis was noted.  CDAI Exam: CDAI Score: -- Patient Global: --; Provider Global: -- Swollen: --; Tender: -- Joint Exam 12/05/2022   No joint exam has been documented for this visit   There is currently no information documented on the homunculus. Go to the Rheumatology activity and complete the homunculus joint exam.  Investigation: No additional findings.  Imaging: No results found.  Recent Labs: Lab Results  Component Value Date   WBC 6.7 07/05/2022   HGB 13.7 07/05/2022   PLT 252 07/05/2022   NA 140 07/05/2022   K 4.2 07/05/2022   CL 105 07/05/2022   CO2 28 07/05/2022   GLUCOSE 119 (H) 07/05/2022   BUN 15 07/05/2022   CREATININE 0.96 07/05/2022   BILITOT 0.5 07/05/2022   ALKPHOS 84 02/12/2019   AST 19 07/05/2022   ALT 25 07/05/2022   PROT 6.8 07/05/2022   ALBUMIN 4.2  02/12/2019   CALCIUM 9.3 07/05/2022   GFRAA 91 07/22/2020   QFTBGOLDPLUS NEGATIVE 04/22/2022    Speciality Comments: Inadequate response to Enbrel and Humira Cosentyx November 2017  Procedures:  No procedures performed Allergies: Patient has no known allergies.   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  He has had intermittent inflammation in the past.  He has not had synovitis since the last visit per patient.  He states he missed the last dose of Cosentyx and methotrexate.  He would like refills today.  He continues to have extensive psoriasis.  He states he had a delay in few shots  but has not missed any Cosyntex doses or methotrexate doses.  Patient has been on Cosentyx since November 2017.  I discussed the option of switching to Taltz.  He was in agreement.  Indications side effects contraindications were discussed.  A handout was given and consent was taken.  Will apply for Toltz. Medication counseling   Does patient have a history of inflammatory bowel disease? No  Counseled patient that Altamease Oiler is a IL-17 inhibitor that works to reduce pain and inflammation associated with arthritis.  Counseled patient on purpose, proper use, and adverse effects of Taltz. Reviewed the most common adverse effects of infection, inflammatory bowel disease, and allergic reaction. Counseled patient that Altamease Oiler should be held for infection and prior to scheduled surgery.  Counseled patient to avoid live vaccines while on Taltz.  Advised patient to get annual influenza vaccine, pneumococcal vaccine, and Shingrix as indicated.  Reviewed storage information for Taltz.  Reviewed the importance of regular labs while on Taltz. Standing orders placed and is to return in 1 month and then every 3 months after initiation.  Provided patient with medication education material  and answered all questions.  Patient consented to Taltz.  Will upload consent into patient's chart.  Will apply for Taltz through patient's insurance and update when we receive a response.  Advised initial injection must be administered in office.  Patient voiced understanding.    Taltz dose will be: For psoriatic arthritis and plaque psoriasis overlap load of 160 mg then 80 mg on weeks 2,4,6,8,10,12 then 80 mg every 28 days  Prescription will be sent to pharmacy pending lab results and insurance approval.   Psoriasis-he has been experiencing psoriasis patches on his lower extremities.  He had psoriasis patches on his elbows and extensive psoriasis on his bilateral shins.  He has been using clobetasol which is not helping much.  Sacroiliitis (HCC)-he had noted Raynauds over SI joints.  High risk medication use - Cosentyx 150 mg sq injections every 2 weeks, Rasuvo 20mg  sq q wk, and folic acid 2 mg po qd. (Failed Enbrel & Humira).  He has not been compliant with the labs every 3 months.  Which was emphasized to monitor drug toxicity.  Will check labs today and then every 3 months.  Labs obtained on 10/05/2022 CBC and CMP were normal.  TB Gold was negative on April 22, 2022.- Plan: CBC with Differential/Platelet, COMPLETE METABOLIC PANEL WITH GFR.  Information on immunization was placed in the AVS.  He will discontinue Cosyntex once Altamease Oiler is approved he was advised to hold Taltz if he develops an infection and then resume when the infection resolves.  History of hypertension-blood pressure was normal today at 129/78.  Orders: Orders Placed This Encounter  Procedures   CBC with Differential/Platelet   COMPLETE METABOLIC PANEL WITH GFR   Meds ordered this encounter  Medications   Secukinumab, 300 MG Dose, (COSENTYX SENSOREADY, 300 MG,) 150 MG/ML SOAJ    Sig: INJECT 2 PENS UNDER THE SKIN EVERY 4 WEEKS    Dispense:  6 mL    Refill:  0    Order Specific Question:   Prescription Type:    Answer:    Renewal   Methotrexate, PF, (RASUVO) 20 MG/0.4ML SOAJ    Sig: INJECT 1 PEN UNDER THE SKIN ONCE EVERY WEEK    Dispense:  4.8 mL    Refill:  0     Follow-Up Instructions: Return in about 3 months (around 03/07/2023) for Psoriatic arthritis.   Pollyann Savoy, MD  Note - This record has been created using [employer].  Chart creation errors have been sought, but may not always  have been located. Such creation errors do not reflect on  the standard of medical care.

## 2022-12-05 ENCOUNTER — Ambulatory Visit: Payer: BC Managed Care – PPO | Attending: Rheumatology | Admitting: Rheumatology

## 2022-12-05 ENCOUNTER — Telehealth: Payer: Self-pay | Admitting: Pharmacist

## 2022-12-05 ENCOUNTER — Encounter: Payer: Self-pay | Admitting: Rheumatology

## 2022-12-05 VITALS — BP 129/78 | HR 62 | Resp 15 | Ht 70.0 in | Wt 223.0 lb

## 2022-12-05 DIAGNOSIS — L409 Psoriasis, unspecified: Secondary | ICD-10-CM

## 2022-12-05 DIAGNOSIS — Z79899 Other long term (current) drug therapy: Secondary | ICD-10-CM | POA: Diagnosis not present

## 2022-12-05 DIAGNOSIS — M461 Sacroiliitis, not elsewhere classified: Secondary | ICD-10-CM | POA: Diagnosis not present

## 2022-12-05 DIAGNOSIS — L4050 Arthropathic psoriasis, unspecified: Secondary | ICD-10-CM | POA: Diagnosis not present

## 2022-12-05 DIAGNOSIS — Z8679 Personal history of other diseases of the circulatory system: Secondary | ICD-10-CM

## 2022-12-05 MED ORDER — RASUVO 20 MG/0.4ML ~~LOC~~ SOAJ: SUBCUTANEOUS | 0 refills | Status: DC

## 2022-12-05 MED ORDER — COSENTYX SENSOREADY (300 MG) 150 MG/ML ~~LOC~~ SOAJ: SUBCUTANEOUS | 0 refills | Status: DC

## 2022-12-05 NOTE — Telephone Encounter (Signed)
Patient will be new start to Taltz  Dx: PsA, PsO Dose: 160mg  at Week 0, then 80mg  SQ at Weeks 2, 4, 6, 8, 10, 12 then every 4 weeks thereafter  Previously tried: Enbrel (waning response), Humira (waning response), Cosentyx (current - waning response), MTX (current)  Chesley Mires, PharmD, MPH, BCPS, CPP Clinical Pharmacist (Rheumatology and Pulmonology)

## 2022-12-05 NOTE — Patient Instructions (Addendum)
Standing Labs We placed an order today for your standing lab work.   Please have your standing labs drawn in 1 month after starting Taltz and every 3 months  Please have your labs drawn 2 weeks prior to your appointment so that the provider can discuss your lab results at your appointment, if possible.  Please note that you may see your imaging and lab results in MyChart before we have reviewed them. We will contact you once all results are reviewed. Please allow our office up to 72 hours to thoroughly review all of the results before contacting the office for clarification of your results.  WALK-IN LAB HOURS  Monday through Thursday from 8:00 am -12:30 pm and 1:00 pm-5:00 pm and Friday from 8:00 am-12:00 pm.  Patients with office visits requiring labs will be seen before walk-in labs.  You may encounter longer than normal wait times. Please allow additional time. Wait times may be shorter on  Monday and Thursday afternoons.  We do not book appointments for walk-in labs. We appreciate your patience and understanding with our staff.   Labs are drawn by [facility]. Please bring your co-pay at the time of your lab draw.  You may receive a bill from [facility] for your lab work.  Please note if you are on Hydroxychloroquine and and an order has been placed for a Hydroxychloroquine level,  you will need to have it drawn 4 hours or more after your last dose.  If you wish to have your labs drawn at another location, please call the office 24 hours in advance so we can fax the orders.  The office is located at [address], [address], [place], [place] [address]   If you have any questions regarding directions or hours of operation,  please call [phone].   As a reminder, please drink plenty of water prior to coming for your lab work. Thanks!   Vaccines You are taking a medication(s) that can suppress your immune system.  The following immunizations are recommended: Flu annually Covid-19   RSV Td/Tdap (tetanus, diphtheria, pertussis) every 10 years Pneumonia (Prevnar 15 then Pneumovax 23 at least 1 year apart.  Alternatively, can take Prevnar 20 without needing additional dose) Shingrix: 2 doses from 4 weeks to 6 months apart  Please check with your PCP to make sure you are up to date.  If you have signs or symptoms of an infection or start antibiotics: First, call your PCP for workup of your infection. Hold your medication through the infection, until you complete your antibiotics, and until symptoms resolve if you take the following: Injectable medication (Actemra, Benlysta, Cimzia, Cosentyx, Enbrel, Humira, Kevzara, Orencia, Remicade, Simponi, Stelara, Taltz, Tremfya) Methotrexate Leflunomide (Arava) Mycophenolate (Cellcept) Harriette Ohara, Olumiant, or Rinvoq  Ixekizumab Injection What is this medication? IXEKIZUMAB (ix e KIZ ue mab) treats autoimmune conditions, such as psoriasis and arthritis. It works by slowing down an overactive immune system. It is a monoclonal antibody. This medicine may be used for other purposes; ask your health care provider or pharmacist if you have questions. COMMON BRAND NAME(S): TALTZ What should I tell my care team before I take this medication? They need to know if you have any of these conditions: Immune system problems Infection, such as viral infection, chickenpox, cold sores, or herpes Recently received or are scheduled to receive a vaccine Tuberculosis, a positive skin test for tuberculosis, or recent close contact with someone who has tuberculosis An unusual or allergic reaction to ixekizumab, other medications, foods, dyes,  or preservatives Pregnant or trying to get pregnant Breast-feeding How should I use this medication? This medication is injected under the skin. It is usually given by your care team in a hospital or clinic setting. It may also be given at home. If you get this medication at home, you will be taught how to  prepare and give it. Use exactly as directed. Take it as directed on the prescription label at the same time every day. Keep taking it unless your care team tells you to stop. It is important that you put your used needles and syringes in a special sharps container. Do not put them in a trash can. If you do not have a sharps container, call your pharmacist or care team to get one. A special MedGuide will be given to you by the pharmacist with each prescription and refill. If you are getting this medication in a hospital or clinic, a special MedGuide will be given to you before each treatment. Be sure to read this information carefully each time. Talk to your care team about the use of this medication in children. While it be prescribed for children as young as 6 years for selected conditions, precautions do apply. Overdosage: If you think you have taken too much of this medicine contact a poison control center or emergency room at once. NOTE: This medicine is only for you. Do not share this medicine with others. What if I miss a dose? If you get this medication at the hospital or clinic: It is important not to miss your dose. Call your care team if you are unable to keep an appointment. If you give yourself the medication at home: If you miss a dose, take it as soon as you can. Then continue your normal schedule. Do not take double or extra doses. Call your care team with questions. What may interact with this medication? Do not take this medication with any of the following: Live virus vaccines This medication may also interact with the following: Inactivated vaccines This list may not describe all possible interactions. Give your health care provider a list of all the medicines, herbs, non-prescription drugs, or dietary supplements you use. Also tell them if you smoke, drink alcohol, or use illegal drugs. Some items may interact with your medicine. What should I watch for while using this  medication? Visit your care team for regular checks on your progress. Tell your care team if your symptoms do not start to get better or if they get worse. You will be tested for tuberculosis (TB) before you start this medication. If your care team prescribes any medication for TB, you should start taking the TB medication before starting this medication. Make sure to finish the full course of TB medication. This medication may increase your risk of getting an infection. Call your care team for advice if you get a fever, chills, sore throat, or other symptoms of a cold or flu. Do not treat yourself. Try to avoid being around people who are sick. This medication can decrease the response to a vaccine. If you need to get vaccinated, tell your care team if you have received this medication within the last 6 months. Extra booster doses may be needed. Talk to your care team to see if a different vaccination schedule is needed. What side effects may I notice from receiving this medication? Side effects that you should report to your care team as soon as possible: Allergic reactions--skin rash, itching, hives, swelling of the  face, lips, tongue, or throat Infection--fever, chills, cough, sore throat, wounds that don't heal, pain or trouble when passing urine, general feeling of discomfort or being unwell Sudden or severe stomach pain, bloody diarrhea, fever, nausea, vomiting Side effects that usually do not require medical attention (report to your care team if they continue or are bothersome): Nausea Pain, redness, or irritation at injection site Runny or stuffy nose Sore throat This list may not describe all possible side effects. Call your doctor for medical advice about side effects. You may report side effects to FDA at 1-800-FDA-1088. Where should I keep my medication? Keep out of the reach of children and pets. Store in the refrigerator. Do not freeze. Do not shake. Keep this medication in the  original container. Protect from light. Use the dose within 30 minutes of removing it from the refrigerator. Get rid of any unused medication after the expiration date. To get rid of medications that are no longer needed or have expired: Take the medication to a medication take-back program. Check with your pharmacy or law enforcement to find a location. If you cannot return the medication, ask your pharmacist or care team how to get rid of this medication safely. NOTE: This sheet is a summary. It may not cover all possible information. If you have questions about this medicine, talk to your doctor, pharmacist, or health care provider.  2024 Elsevier/Gold Standard (2021-04-14 00:00:00)

## 2022-12-05 NOTE — Progress Notes (Signed)
Pharmacy Note  Subjective:  Patient presents today to [facility] for follow up office visit.  Patient was seen by the pharmacist for counseling on Taltz for psoriatic arthritis and plaque psoriasis..  Prior therapy includes:Cosentyx (is overdue for dose).  History of inflammatory bowel disease: No  Objective:  CBC    Component Value Date/Time   WBC 6.7 07/05/2022 0809   RBC 4.45 07/05/2022 0809   HGB 13.7 07/05/2022 0809   HGB 14.8 02/12/2019 1401   HCT 40.5 07/05/2022 0809   HCT 42.9 02/12/2019 1401   PLT 252 07/05/2022 0809   PLT 287 02/12/2019 1401   MCV 91.0 07/05/2022 0809   MCV 88 02/12/2019 1401   MCH 30.8 07/05/2022 0809   MCHC 33.8 07/05/2022 0809   RDW 13.6 07/05/2022 0809   RDW 13.0 02/12/2019 1401   LYMPHSABS 1,695 07/05/2022 0809   LYMPHSABS 1.9 02/12/2019 1401   MONOABS 0.7 11/24/2017 0919   EOSABS 94 07/05/2022 0809   EOSABS 0.1 02/12/2019 1401   BASOSABS 34 07/05/2022 0809   BASOSABS 0.0 02/12/2019 1401    CMP     Component Value Date/Time   NA 140 07/05/2022 0809   NA 141 02/12/2019 1401   K 4.2 07/05/2022 0809   CL 105 07/05/2022 0809   CO2 28 07/05/2022 0809   GLUCOSE 119 (H) 07/05/2022 0809   BUN 15 07/05/2022 0809   BUN 13 02/12/2019 1401   CREATININE 0.96 07/05/2022 0809   CALCIUM 9.3 07/05/2022 0809   PROT 6.8 07/05/2022 0809   PROT 7.0 02/12/2019 1401   ALBUMIN 4.2 02/12/2019 1401   AST 19 07/05/2022 0809   ALT 25 07/05/2022 0809   ALKPHOS 84 02/12/2019 1401   BILITOT 0.5 07/05/2022 0809   BILITOT 0.4 02/12/2019 1401   GFRNONAA 78 07/22/2020 0908   GFRAA 91 07/22/2020 0908    Baseline Immunosuppressant Therapy Labs     Latest Ref Rng & Units 04/22/2022    9:34 AM  Quantiferon TB Gold  Quantiferon TB Gold Plus NEGATIVE NEGATIVE    Hepatitis labs - in [facility] 12/15/2011  Hepatitis B surface antigen - negative Hepatitis B core antibody, IgM - negative Hepatitis A antibody, IgM - negative Hepatitis C antibody -  negative   No results found for: "HIV" - not in [facility]     Latest Ref Rng & Units 07/05/2022    8:09 AM  Serum Protein Electrophoresis  Total Protein 6.1 - 8.1 g/dL 6.8   SPEP on 16/02/958 - sent to scan center  Chest Xray:07/31/2017 - There is no active cardiopulmonary disease.  Assessment/Plan:  Counseled patient that Altamease Oiler is a IL-17 inhibitor that works to reduce pain and inflammation associated with arthritis.  Counseled patient on purpose, proper use, and adverse effects of Taltz. Reviewed the most common adverse effects of infection (more commonly nasopharyngitis, URTI), inflammatory bowel disease, and allergic reaction. Counseled patient that Altamease Oiler should be held for infection and prior to scheduled surgery.  Counseled patient to avoid live vaccines while on Taltz. Recommend annual influenza, PCV 15 or PCV20 or Pneumovax 23, and Shingrix as indicated. Reviewed storage information for Taltz.  Reviewed the importance of regular labs while on Taltz. Will monitor CBC and CMP 1 month after starting and every 3 months routinely thereafter. Will monitor TB gold annually. Standing orders placed. Provided patient with medication education material and answered all questions.  Patient consented to Taltz.  Will upload consent into patient's chart.  Will apply for Taltz through patient's insurance  and update when we receive a response.  Advised initial injection must be administered in office.  Patient voiced understanding.    Taltz dose will be: For psoriatic arthritis and plaque psoriasis overlap load of 160 mg then 80 mg on weeks 2,4,6,8,10,12 then 80 mg every 28 days  Prescription will be sent to pharmacy pending lab results and insurance approval.  Patient will withhold taking Cosentyx this week to prevent delay in starting Taltz once approved  Chesley Mires, PharmD, MPH, BCPS, CPP Clinical Pharmacist (Rheumatology and Pulmonology)

## 2022-12-06 LAB — CBC WITH DIFFERENTIAL/PLATELET
Absolute Lymphocytes: 2142 {cells}/uL (ref 850–3900)
Absolute Monocytes: 833 {cells}/uL (ref 200–950)
Basophils Absolute: 34 {cells}/uL (ref 0–200)
Basophils Relative: 0.4 %
Eosinophils Absolute: 94 {cells}/uL (ref 15–500)
Eosinophils Relative: 1.1 %
HCT: 44 % (ref 38.5–50.0)
Hemoglobin: 14.7 g/dL (ref 13.2–17.1)
MCH: 30.6 pg (ref 27.0–33.0)
MCHC: 33.4 g/dL (ref 32.0–36.0)
MCV: 91.5 fL (ref 80.0–100.0)
MPV: 11.8 fL (ref 7.5–12.5)
Monocytes Relative: 9.8 %
Neutro Abs: 5398 {cells}/uL (ref 1500–7800)
Neutrophils Relative %: 63.5 %
Platelets: 270 10*3/uL (ref 140–400)
RBC: 4.81 10*6/uL (ref 4.20–5.80)
RDW: 13.8 % (ref 11.0–15.0)
Total Lymphocyte: 25.2 %
WBC: 8.5 10*3/uL (ref 3.8–10.8)

## 2022-12-06 LAB — COMPLETE METABOLIC PANEL WITH GFR
AG Ratio: 1.6 (calc) (ref 1.0–2.5)
ALT: 25 U/L (ref 9–46)
AST: 19 U/L (ref 10–35)
Albumin: 4.5 g/dL (ref 3.6–5.1)
Alkaline phosphatase (APISO): 77 U/L (ref 35–144)
BUN: 13 mg/dL (ref 7–25)
CO2: 26 mmol/L (ref 20–32)
Calcium: 9.9 mg/dL (ref 8.6–10.3)
Chloride: 106 mmol/L (ref 98–110)
Creat: 1.1 mg/dL (ref 0.70–1.30)
Globulin: 2.8 g/dL (ref 1.9–3.7)
Glucose, Bld: 97 mg/dL (ref 65–99)
Potassium: 4.4 mmol/L (ref 3.5–5.3)
Sodium: 142 mmol/L (ref 135–146)
Total Bilirubin: 0.4 mg/dL (ref 0.2–1.2)
Total Protein: 7.3 g/dL (ref 6.1–8.1)
eGFR: 79 mL/min/{1.73_m2} (ref >=60)

## 2022-12-06 NOTE — Progress Notes (Signed)
CBC and CMP are normal.

## 2022-12-06 NOTE — Telephone Encounter (Signed)
Submitted a Prior Authorization request to [facility] for TALTZ via CoverMyMeds. Pending clinical questions to populate in [facility]  Key: [id number]

## 2022-12-09 NOTE — Telephone Encounter (Signed)
Called [employer] for form for Altamease Oiler form since questions have not populated in [employer], Rep will fax PA form to clinic  PA # [id number] Phone: [phone]  Chesley Mires, PharmD, MPH, BCPS, CPP Clinical Pharmacist (Rheumatology and Pulmonology)

## 2022-12-12 NOTE — Telephone Encounter (Signed)
Received PA form from [employer]. Completed form and faxed to [employer] with clinicals  PA # [phone] Phone: [phone] Fax: [phone]  Chesley Mires, PharmD, MPH, BCPS, CPP Clinical Pharmacist (Rheumatology and Pulmonology)=

## 2022-12-13 ENCOUNTER — Other Ambulatory Visit: Payer: Self-pay | Admitting: Physician Assistant

## 2022-12-15 NOTE — Telephone Encounter (Signed)
Ok to appeal for [employer].

## 2022-12-15 NOTE — Telephone Encounter (Signed)
Received a fax regarding Prior Authorization from [facility] for TALTZ. Authorization has been DENIED because you must have tried other preferred drugs and they did not work or we provide medical reason why patient cannot take all of the preferred drugs Preferred physician administered: Avsola, Remicade, Simponi Aria Preferred self-administered:  Marcos Eke, Skyrizi, [place], [place], Cimzia  Pt has tried: Enbrel, Humira, currently on Cosentyx. Unlikely to respond to Cimzia since he's failed two TNF inhibitors Do not see contraindications to JAK inhibitors  Chesley Mires, PharmD, MPH, BCPS, CPP Clinical Pharmacist (Rheumatology and Pulmonology)

## 2022-12-28 NOTE — Telephone Encounter (Signed)
Submitted an URGENT appeal to [facility] for TALTZ. Turnaround time is 72 hours if accepted for urgent review  Reference # [id number] Fax: [phone]  Chesley Mires, PharmD, MPH, BCPS, CPP Clinical Pharmacist (Rheumatology and Pulmonology)

## 2022-12-29 NOTE — Telephone Encounter (Signed)
Received notification from [facility] regarding a prior authorization for TALTZ. Authorization has been APPROVED from 11/27/2022 to 12/28/2023. Approval letter sent to scan center.  Unable to run test claim because patient must fill through [facility]: [phone]  Authorization # [id number]  Patient scheduled for Taltz new start on 01/09/2023. He will plan to hold Cosentyx dose that is due next Thursday.  Enrolled patient into Taltz copay card while I had him on phone: RXBIN: [id number] PCN: [id number] GRP: [id number] ID: [id number] Expiration Date: 02/06/2025  Chesley Mires, PharmD, MPH, BCPS, CPP Clinical Pharmacist (Rheumatology and Pulmonology)

## 2023-01-09 ENCOUNTER — Ambulatory Visit: Payer: BC Managed Care – PPO | Attending: Rheumatology | Admitting: Pharmacist

## 2023-01-09 DIAGNOSIS — L4050 Arthropathic psoriasis, unspecified: Secondary | ICD-10-CM

## 2023-01-09 DIAGNOSIS — Z111 Encounter for screening for respiratory tuberculosis: Secondary | ICD-10-CM

## 2023-01-09 DIAGNOSIS — Z7189 Other specified counseling: Secondary | ICD-10-CM

## 2023-01-09 DIAGNOSIS — L409 Psoriasis, unspecified: Secondary | ICD-10-CM

## 2023-01-09 DIAGNOSIS — Z79899 Other long term (current) drug therapy: Secondary | ICD-10-CM

## 2023-01-09 MED ORDER — TALTZ 80 MG/ML ~~LOC~~ SOAJ: SUBCUTANEOUS | 0 refills | Status: DC

## 2023-01-09 NOTE — Patient Instructions (Addendum)
Your next TALTZ dose is due on 01/23/23, 02/06/23, 02/20/23, 03/06/23, 03/20/23, 04/03/23 then every 4 weeks thereafter (starting on 05/01/23)  CONTINUE Rasuvo 20mg  SQ once weekly with folic acid 2mg  daily  HOLD TALTZ and RASUVO if you have signs or symptoms of an infection. You can resume once you feel better or back to your baseline. HOLD TALTZ and RASUVO if you start antibiotics to treat an infection. HOLD TALTZ and RASUVO around the time of surgery/procedures. Your surgeon will be able to provide recommendations on when to hold BEFORE and when you are cleared to RESUME.  Pharmacy information: Your prescription will [facility] shipped from [facility]. Their phone number is [phone] Please call to schedule shipment and confirm address. They will mail your medication to your home.  Cost information: Your copay should be affordable. If you call the pharmacy and it is not affordable, please double-check that they are billing through your copay card as secondary coverage. That copay card information is: BIN: [id number] PCN: [id number] GROUP: [id number] ID: [id number] Expiration Date: 02/06/2025  Labs are due in 1 month then every 3 months. Lab hours are from Monday to Thursday 8am-12:30pm and 1pm-5pm and Friday 8am-12pm. You do not need an appointment if you come for labs during these times. If you'd like to go to a [facility] or [facility] closer to home, please call our clinic 48 hours prior to lab date so we can release orders in a timely manner.  Stay up to date on all routine vaccines: influenza, pneumonia, COVID19, Shingles  How to manage an injection site reaction: Remember the 5 C's: COUNTER - leave on the counter at least 30 minutes but up to overnight to bring medication to room temperature. This may help prevent stinging COLD - place something cold (like an ice gel pack or cold water bottle) on the injection site just before cleansing with alcohol. This may help reduce pain CLARITIN - use  Claritin (generic name is loratadine) for the first two weeks of treatment or the day of, the day before, and the day after injecting. This will help to minimize injection site reactions CORTISONE CREAM - apply if injection site is irritated and itching CALL ME - if injection site reaction is bigger than the size of your fist, looks infected, blisters, or if you develop hives

## 2023-01-09 NOTE — Progress Notes (Signed)
Pharmacy Note  Subjective:   Patient presents to clinic today to receive first dose of Taltz for PsA + PsO. Patient is switching from Cosentyx SQ (last dose was on 12/22/2022)  Patient running a fever or have signs/symptoms of infection? No  Patient currently on antibiotics for the treatment of infection? No  Patient have any upcoming invasive procedures/surgeries? No  Objective: CMP     Component Value Date/Time   NA 142 12/05/2022 0943   NA 141 02/12/2019 1401   K 4.4 12/05/2022 0943   CL 106 12/05/2022 0943   CO2 26 12/05/2022 0943   GLUCOSE 97 12/05/2022 0943   BUN 13 12/05/2022 0943   BUN 13 02/12/2019 1401   CREATININE 1.10 12/05/2022 0943   CALCIUM 9.9 12/05/2022 0943   PROT 7.3 12/05/2022 0943   PROT 7.0 02/12/2019 1401   ALBUMIN 4.2 02/12/2019 1401   AST 19 12/05/2022 0943   ALT 25 12/05/2022 0943   ALKPHOS 84 02/12/2019 1401   BILITOT 0.4 12/05/2022 0943   BILITOT 0.4 02/12/2019 1401   GFRNONAA 78 07/22/2020 0908   GFRAA 91 07/22/2020 0908    CBC    Component Value Date/Time   WBC 8.5 12/05/2022 0943   RBC 4.81 12/05/2022 0943   HGB 14.7 12/05/2022 0943   HGB 14.8 02/12/2019 1401   HCT 44.0 12/05/2022 0943   HCT 42.9 02/12/2019 1401   PLT 270 12/05/2022 0943   PLT 287 02/12/2019 1401   MCV 91.5 12/05/2022 0943   MCV 88 02/12/2019 1401   MCH 30.6 12/05/2022 0943   MCHC 33.4 12/05/2022 0943   RDW 13.8 12/05/2022 0943   RDW 13.0 02/12/2019 1401   LYMPHSABS 1,695 07/05/2022 0809   LYMPHSABS 1.9 02/12/2019 1401   MONOABS 0.7 11/24/2017 0919   EOSABS 94 12/05/2022 0943   EOSABS 0.1 02/12/2019 1401   BASOSABS 34 12/05/2022 0943   BASOSABS 0.0 02/12/2019 1401    Baseline Immunosuppressant Therapy Labs TB GOLD    Latest Ref Rng & Units 04/22/2022    9:34 AM  Quantiferon TB Gold  Quantiferon TB Gold Plus NEGATIVE NEGATIVE    Hepatitis labs - in [facility] 12/15/2011  Hepatitis B surface antigen - negative Hepatitis B core antibody, IgM -  negative Hepatitis A antibody, IgM - negative Hepatitis C antibody - negative  SPEP on 12/15/2011 - sent to scan center   Chest x-ray:07/31/2017 - There is no active cardiopulmonary disease.  Assessment/Plan:  Reviewed importance of holding Taltz with signs/symptoms of an infections, if antibiotics are prescribed to treat an active infection, and with invasive procedures  Demonstrated proper injection technique with Taltz demo device  Patient able to demonstrate proper injection technique using the teach back method.  Patient self injected in the right lower abdomen and left lower abdomen with :  Sample Medication: Taltz 80mg /mL autoinjector Lot: [id number] Expiration: 09/10/2023  Sample Medication: Altamease Oiler 80mg /mL autoinjector Lot: [id number] Expiration: 05/20/2024  Patient tolerated well.  Observed for 30 mins in office for adverse reaction. Patient denies itchiness and irritation at injection., No swelling or redness noted., and Reviewed injection site reaction management with patient verbally and printed information for review in AVS  Patient is to return in 1 month for labs and 6-8 weeks for follow-up appointment.  Standing orders for CBC/CMP placed.  TB gold will be monitored yearly.   Taltz approved through insurance .   [facility]: [phone].  Patient provided with pharmacy phone number and advised to call later this  week to schedule shipment to home.  Patient will continue Taltz 160mg  at Week 0 (administered in clinic today), then Taltz 80mg  SQ at [facility] 2, 4, 6, 8, 10, 12 then every 4 weeks thereafter. He will continue Rasuvo 20mg  SQ every 7 days with folic acid 2mg  daily.  All questions encouraged and answered.  Instructed patient to call with any further questions or concerns.  Chesley Mires, PharmD, MPH, BCPS, CPP Clinical Pharmacist (Rheumatology and Pulmonology)  01/09/2023 8:46 AM

## 2023-01-10 MED ORDER — TALTZ 80 MG/ML ~~LOC~~ SOAJ: SUBCUTANEOUS | 0 refills | Status: DC

## 2023-01-10 NOTE — Addendum Note (Signed)
Addended by: Murrell Redden on: 01/10/2023 08:36 AM   Modules accepted: Orders

## 2023-01-10 NOTE — Progress Notes (Signed)
Spoke with Alycia Rossetti, [facility], regarding rx clarification for patient's Taltz loading. Verbal rx provided for them to dispense Altamease Oiler

## 2023-01-11 ENCOUNTER — Telehealth: Payer: Self-pay | Admitting: *Deleted

## 2023-01-11 NOTE — Telephone Encounter (Signed)
Returned call -- I spoke with Lucianne Muss yesterday about this clarification. Apparently nothing was written down yesterday  Spoke with Consuella Lose, RpH, and provided verbalr rx AGAIN  Chesley Mires, PharmD, MPH, BCPS, CPP Clinical Pharmacist (Rheumatology and Pulmonology)

## 2023-01-11 NOTE — Telephone Encounter (Signed)
[facility] contacted the office and left message stating they need to know if  the patient needs the started or the maintenance dose for the medication sent in. Call back number is [phone] ext [phone].

## 2023-02-20 NOTE — Progress Notes (Deleted)
Office Visit Note  Patient: Michael Peck             Date of Birth: [birth year]           MRN: [id number]             PCP: Jerl Mina, MD Referring: Jerl Mina, MD Visit Date: 03/06/2023 Occupation: @GUAROCC @  Subjective:    History of Present Illness: Mkai Ferryman is a 58 y.o. male with history of psoriatic arthritis. Patient remains on  Cosentyx 150 mg sq injections every 2 weeks, Rasuvo 20mg  sq q wk, and folic acid 2 mg po qd.   CBC and CMP updated on 12/05/22. Orders for CBC and CMP released today.   TB gold negative on 04/22/22.  Discussed the importance of holding cosentyx if he develops signs or symptoms of an infection and to resume once the infection has completely cleared.   Activities of Daily Living:  Patient reports morning stiffness for *** {minute/hour:19697}.   Patient {ACTIONS;DENIES/REPORTS:21021675::"Denies"} nocturnal pain.  Difficulty dressing/grooming: {ACTIONS;DENIES/REPORTS:21021675::"Denies"} Difficulty climbing stairs: {ACTIONS;DENIES/REPORTS:21021675::"Denies"} Difficulty getting out of chair: {ACTIONS;DENIES/REPORTS:21021675::"Denies"} Difficulty using hands for taps, buttons, cutlery, and/or writing: {ACTIONS;DENIES/REPORTS:21021675::"Denies"}  No Rheumatology ROS completed.   PMFS History:  Patient Active Problem List   Diagnosis Date Noted   High risk medication use 12/10/2015   Low back pain 12/10/2015   Obesity 12/10/2015   Psoriasis 12/09/2015   Psoriatic arthritis (HCC) 12/09/2015    Past Medical History:  Diagnosis Date   Arthritis    Hypertension    Psoriasis 12/09/2015   Psoriatic arthritis (HCC) 12/09/2015   Failed Enbrel    Family History  Problem Relation Age of Onset   Epilepsy Mother    Cancer Father        prostate    Healthy Daughter    Past Surgical History:  Procedure Laterality Date   ADENOIDECTOMY     Social History   Social History Narrative   Not on file   Immunization History  Administered Date(s)  Administered   Influenza Inj Mdck Quad Pf 11/07/2016, 11/03/2017   Influenza, High Dose Seasonal PF 11/07/2016, 11/03/2017   Influenza,inj,Quad PF,6+ Mos 11/03/2018   Influenza-Unspecified 11/07/2016, 11/01/2019   PFIZER(Purple Top)SARS-COV-2 Vaccination 05/17/2019, 06/12/2019, 11/01/2019     Objective: Vital Signs: There were no vitals taken for this visit.   Physical Exam Vitals and nursing note reviewed.  Constitutional:      Appearance: He is well-developed.  HENT:     Head: Normocephalic and atraumatic.  Eyes:     Conjunctiva/sclera: Conjunctivae normal.     Pupils: Pupils are equal, round, and reactive to light.  Cardiovascular:     Rate and Rhythm: Normal rate and regular rhythm.     Heart sounds: Normal heart sounds.  Pulmonary:     Effort: Pulmonary effort is normal.     Breath sounds: Normal breath sounds.  Abdominal:     General: Bowel sounds are normal.     Palpations: Abdomen is soft.  Musculoskeletal:     Cervical back: Normal range of motion and neck supple.  Skin:    General: Skin is warm and dry.     Capillary Refill: Capillary refill takes less than 2 seconds.  Neurological:     Mental Status: He is alert and oriented to person, place, and time.  Psychiatric:        Behavior: Behavior normal.      Musculoskeletal Exam: ***  CDAI Exam: CDAI Score: -- Patient Global: --; Provider Global: --  Swollen: --; Tender: -- Joint Exam 03/06/2023   No joint exam has been documented for this visit   There is currently no information documented on the homunculus. Go to the Rheumatology activity and complete the homunculus joint exam.  Investigation: No additional findings.  Imaging: No results found.  Recent Labs: Lab Results  Component Value Date   WBC 8.5 12/05/2022   HGB 14.7 12/05/2022   PLT 270 12/05/2022   NA 142 12/05/2022   K 4.4 12/05/2022   CL 106 12/05/2022   CO2 26 12/05/2022   GLUCOSE 97 12/05/2022   BUN 13 12/05/2022   CREATININE  1.10 12/05/2022   BILITOT 0.4 12/05/2022   ALKPHOS 84 02/12/2019   AST 19 12/05/2022   ALT 25 12/05/2022   PROT 7.3 12/05/2022   ALBUMIN 4.2 02/12/2019   CALCIUM 9.9 12/05/2022   GFRAA 91 07/22/2020   QFTBGOLDPLUS NEGATIVE 04/22/2022    Speciality Comments: Inadequate response to Enbrel and Humira Cosentyx November 2017  Procedures:  No procedures performed Allergies: Patient has no known allergies.   Assessment / Plan:     Visit Diagnoses: Psoriatic arthritis (HCC)  Psoriasis  High risk medication use  Sacroiliitis (HCC)  History of hypertension  History of obesity  Orders: No orders of the defined types were placed in this encounter.  No orders of the defined types were placed in this encounter.   Face-to-face time spent with patient was *** minutes. Greater than 50% of time was spent in counseling and coordination of care.  Follow-Up Instructions: No follow-ups on file.   Gearldine Bienenstock, PA-C  Note - This record has been created using Dragon software.  Chart creation errors have been sought, but may not always  have been located. Such creation errors do not reflect on  the standard of medical care.

## 2023-03-06 ENCOUNTER — Ambulatory Visit: Payer: BC Managed Care – PPO | Admitting: Physician Assistant

## 2023-03-06 DIAGNOSIS — Z8679 Personal history of other diseases of the circulatory system: Secondary | ICD-10-CM

## 2023-03-06 DIAGNOSIS — L409 Psoriasis, unspecified: Secondary | ICD-10-CM

## 2023-03-06 DIAGNOSIS — M461 Sacroiliitis, not elsewhere classified: Secondary | ICD-10-CM

## 2023-03-06 DIAGNOSIS — L4050 Arthropathic psoriasis, unspecified: Secondary | ICD-10-CM

## 2023-03-06 DIAGNOSIS — Z79899 Other long term (current) drug therapy: Secondary | ICD-10-CM

## 2023-03-06 DIAGNOSIS — Z8639 Personal history of other endocrine, nutritional and metabolic disease: Secondary | ICD-10-CM

## 2023-03-10 NOTE — Progress Notes (Signed)
 Office Visit Note  Patient: Michael Peck             Date of Birth: [birth year]           MRN: [id number]             PCP: Valora Agent, MD Referring: Valora Agent, MD Visit Date: 03/16/2023 Occupation: @GUAROCC @  Subjective:  Medication monitoring   History of Present Illness: Michael Peck is a 58 y.o. male with history of psoriatic arthritis. Patient remains on Rasuvo  20mg  sq q wk and folic acid  2 mg po qd. He was started on taltz  80 mg sq injections on 01/09/23.  He has been tolerating taltz  without any side effects or injection site reactions.  He has not yet completed the loading dose of Taltz  but has already noticed about a 60% improvement in his symptoms.  The psoriasis on his elbows and lower legs has started to clear.  He denies any increased joint pain or joint swelling at this time.  He states the pain in his SI joints has resolved.  He denies any Achilles tendinitis or plantar fasciitis.  He denies any joint swelling at this time.  His morning stiffness continues to last for about 1 hour daily. He denies any recent or recurrent infections.    Activities of Daily Living:  Patient reports morning stiffness for 1 hour.   Patient Reports nocturnal pain.  Difficulty dressing/grooming: Denies Difficulty climbing stairs: Denies Difficulty getting out of chair: Denies Difficulty using hands for taps, buttons, cutlery, and/or writing: Denies  Review of Systems  Constitutional:  Negative for fatigue.  HENT:  Negative for mouth sores, mouth dryness and nose dryness.   Eyes:  Negative for pain and dryness.  Respiratory:  Negative for shortness of breath and difficulty breathing.   Cardiovascular:  Negative for chest pain and palpitations.  Gastrointestinal:  Negative for blood in stool, constipation and diarrhea.  Endocrine: Negative for increased urination.  Genitourinary:  Negative for involuntary urination.  Musculoskeletal:  Positive for joint pain, joint pain and morning  stiffness. Negative for gait problem, joint swelling, myalgias, muscle weakness, muscle tenderness and myalgias.  Skin:  Negative for color change, rash, hair loss and sensitivity to sunlight.  Allergic/Immunologic: Negative for susceptible to infections.  Neurological:  Negative for dizziness and headaches.  Hematological:  Negative for swollen glands.  Psychiatric/Behavioral:  Negative for depressed mood and sleep disturbance. The patient is not nervous/anxious.     PMFS History:  Patient Active Problem List   Diagnosis Date Noted   High risk medication use 12/10/2015   Low back pain 12/10/2015   Obesity 12/10/2015   Psoriasis 12/09/2015   Psoriatic arthritis (HCC) 12/09/2015    Past Medical History:  Diagnosis Date   Arthritis    Hypertension    Psoriasis 12/09/2015   Psoriatic arthritis (HCC) 12/09/2015   Failed Enbrel    Family History  Problem Relation Age of Onset   Epilepsy Mother    Cancer Father        prostate    Healthy Daughter    Past Surgical History:  Procedure Laterality Date   ADENOIDECTOMY     Social History   Social History Narrative   Not on file   Immunization History  Administered Date(s) Administered   Influenza Inj Mdck Quad Pf 11/07/2016, 11/03/2017   Influenza, High Dose Seasonal PF 11/07/2016, 11/03/2017   Influenza,inj,Quad PF,6+ Mos 11/03/2018   Influenza-Unspecified 11/07/2016, 11/01/2019   PFIZER(Purple Top)SARS-COV-2 Vaccination 05/17/2019, 06/12/2019, 11/01/2019  Objective: Vital Signs: BP (!) 145/84 (BP Location: Left Arm, Patient Position: Sitting, Cuff Size: Normal)   Pulse 60   Resp 16   Ht 5' 10 (1.778 m)   Wt 230 lb 3.2 oz (104.4 kg)   BMI 33.03 kg/m    Physical Exam Vitals and nursing note reviewed.  Constitutional:      Appearance: He is well-developed.  HENT:     Head: Normocephalic and atraumatic.  Eyes:     Conjunctiva/sclera: Conjunctivae normal.     Pupils: Pupils are equal, round, and reactive to  light.  Cardiovascular:     Rate and Rhythm: Normal rate and regular rhythm.     Heart sounds: Normal heart sounds.  Pulmonary:     Effort: Pulmonary effort is normal.     Breath sounds: Normal breath sounds.  Abdominal:     General: Bowel sounds are normal.     Palpations: Abdomen is soft.  Musculoskeletal:     Cervical back: Normal range of motion and neck supple.  Skin:    General: Skin is warm and dry.     Capillary Refill: Capillary refill takes less than 2 seconds.  Neurological:     Mental Status: He is alert and oriented to person, place, and time.  Psychiatric:        Behavior: Behavior normal.      Musculoskeletal Exam: C-spine has limited range of motion.  No midline spinal tenderness.  No SI joint tenderness.  Shoulder joints, elbow joints, and wrist joints have good range of motion with no synovitis.  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  No dactylitis noted.  PIP and DIP thickening noted.  Hip joints have good range of motion with no groin pain.  Knee joints have good range of motion with no warmth or effusion.  Ankle joints have good range of motion with no tenderness or joint swelling.  No evidence of Achilles tendinitis or plantar fasciitis.  CDAI Exam: CDAI Score: -- Patient Global: --; Provider Global: -- Swollen: --; Tender: -- Joint Exam 03/16/2023   No joint exam has been documented for this visit   There is currently no information documented on the homunculus. Go to the Rheumatology activity and complete the homunculus joint exam.  Investigation: No additional findings.  Imaging: No results found.  Recent Labs: Lab Results  Component Value Date   WBC 8.5 12/05/2022   HGB 14.7 12/05/2022   PLT 270 12/05/2022   NA 142 12/05/2022   K 4.4 12/05/2022   CL 106 12/05/2022   CO2 26 12/05/2022   GLUCOSE 97 12/05/2022   BUN 13 12/05/2022   CREATININE 1.10 12/05/2022   BILITOT 0.4 12/05/2022   ALKPHOS 84  02/12/2019   AST 19 12/05/2022   ALT 25 12/05/2022   PROT 7.3 12/05/2022   ALBUMIN 4.2 02/12/2019   CALCIUM 9.9 12/05/2022   GFRAA 91 07/22/2020   QFTBGOLDPLUS NEGATIVE 04/22/2022    Speciality Comments: Inadequate response to Enbrel and Humira Cosentyx  November 2017  Procedures:  No procedures performed Allergies: Patient has no known allergies.   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  No evidence of Achilles tendinitis or plantar fasciitis.  No SI joint tenderness upon palpation.  Overall he has noticed about a 60% improvement in his  symptoms since switching from Cosentyx  to Taltz .  He is tolerating Taltz  without any side effects or injection site reactions.  He remains on the loading dose of Taltz  but will be transitioning to the maintenance dose of Taltz  80 mg sq injections every 28 days.  He will remain on Rasuvo  20 mg sq injections once weekly along with folic acid  2 mg daily.  He was advised to notify us  if he develops any new or worsening symptoms.  He will follow-up in the office in 3 months or sooner if needed.  Psoriasis: He has noticed about a 60% improvement in skin clearance since initiating Taltz .  He will remain on Taltz  and Rasuvo  as combination therapy.  High risk medication use -Taltz  80 mg sq injections-still completing loading dose.  Maintenance dose of Taltz  will be 80 mg sq injections every 28 days.  He will remain on Rasuvo  20mg  sq q wk and folic acid  2 mg po qd.  Inadequate response to Enbrel, Humira, and Cosentyx .   CBC and CMP updated on 12/05/22. Orders for CBC and CMP released today.  TB gold negative on 04/22/22.  Future order for TB gold placed today. No recent or recurrent infections. Discussed the importance of holding cosentyx  if he develops signs or  symptoms of an infection and to resume once the infection has completely cleared.  Plan: CBC with Differential/Platelet, COMPLETE METABOLIC PANEL WITH GFR, QuantiFERON-TB Gold Plus  Screening for tuberculosis -Future order for TB gold placed today.  Plan: QuantiFERON-TB Gold Plus  Sacroiliitis [facility]): No SI joint tenderness upon palpation.  No nocturnal pain.  Improved since initiating Taltz .  Other medical conditions are listed as follows:   History of hypertension: Blood pressure was 145/84 today in the office.  History of obesity    Orders: Orders Placed This Encounter  Procedures   CBC with Differential/Platelet   COMPLETE METABOLIC PANEL WITH GFR   QuantiFERON-TB Gold Plus   Meds ordered this encounter  Medications   Methotrexate , PF, (RASUVO ) 20 MG/0.4ML SOAJ    Sig: INJECT 1 PEN UNDER THE SKIN ONCE EVERY WEEK    Dispense:  4.8 mL    Refill:  0    Follow-Up Instructions: Return in 3 months (on 06/13/2023) for Psoriatic arthritis.   Waddell CHRISTELLA Craze, PA-C  Note - This record has been created using Dragon software.  Chart creation errors have been sought, but may not always  have been located. Such creation errors do not reflect on  the standard of medical care.

## 2023-03-15 ENCOUNTER — Other Ambulatory Visit: Payer: Self-pay | Admitting: Rheumatology

## 2023-03-16 ENCOUNTER — Encounter: Payer: Self-pay | Admitting: Physician Assistant

## 2023-03-16 ENCOUNTER — Ambulatory Visit: Payer: BC Managed Care – PPO | Attending: Physician Assistant | Admitting: Physician Assistant

## 2023-03-16 VITALS — BP 145/84 | HR 60 | Resp 16 | Ht 70.0 in | Wt 230.2 lb

## 2023-03-16 DIAGNOSIS — Z111 Encounter for screening for respiratory tuberculosis: Secondary | ICD-10-CM

## 2023-03-16 DIAGNOSIS — Z79899 Other long term (current) drug therapy: Secondary | ICD-10-CM | POA: Diagnosis not present

## 2023-03-16 DIAGNOSIS — L409 Psoriasis, unspecified: Secondary | ICD-10-CM | POA: Diagnosis not present

## 2023-03-16 DIAGNOSIS — Z8639 Personal history of other endocrine, nutritional and metabolic disease: Secondary | ICD-10-CM

## 2023-03-16 DIAGNOSIS — M461 Sacroiliitis, not elsewhere classified: Secondary | ICD-10-CM

## 2023-03-16 DIAGNOSIS — L4050 Arthropathic psoriasis, unspecified: Secondary | ICD-10-CM | POA: Diagnosis not present

## 2023-03-16 DIAGNOSIS — Z8679 Personal history of other diseases of the circulatory system: Secondary | ICD-10-CM

## 2023-03-16 MED ORDER — RASUVO 20 MG/0.4ML ~~LOC~~ SOAJ: SUBCUTANEOUS | 0 refills | Status: DC

## 2023-03-16 NOTE — Patient Instructions (Signed)
 Standing Labs We placed an order today for your standing lab work.   Please have your standing labs drawn in mid-March for TB gold    Please have your labs drawn 2 weeks prior to your appointment so that the provider can discuss your lab results at your appointment, if possible.  Please note that you may see your imaging and lab results in MyChart before we have reviewed them. We will contact you once all results are reviewed. Please allow our office up to 72 hours to thoroughly review all of the results before contacting the office for clarification of your results.  WALK-IN LAB HOURS  Monday through Thursday from 8:00 am -12:30 pm and 1:00 pm-5:00 pm and Friday from 8:00 am-12:00 pm.  Patients with office visits requiring labs will be seen before walk-in labs.  You may encounter longer than normal wait times. Please allow additional time. Wait times may be shorter on  Monday and Thursday afternoons.  We do not book appointments for walk-in labs. We appreciate your patience and understanding with our staff.   Labs are drawn by [facility]. Please bring your co-pay at the time of your lab draw.  You may receive a bill from [facility] for your lab work.  Please note if you are on Hydroxychloroquine and and an order has been placed for a Hydroxychloroquine level,  you will need to have it drawn 4 hours or more after your last dose.  If you wish to have your labs drawn at another location, please call the office 24 hours in advance so we can fax the orders.  The office is located at [address], [place], [place] [address]   If you have any questions regarding directions or hours of operation,  please call [phone].   As a reminder, please drink plenty of water prior to coming for your lab work. Thanks!

## 2023-03-17 LAB — CBC WITH DIFFERENTIAL/PLATELET
Absolute Lymphocytes: 2579 {cells}/uL (ref 850–3900)
Absolute Monocytes: 890 {cells}/uL (ref 200–950)
Basophils Absolute: 17 {cells}/uL (ref 0–200)
Basophils Relative: 0.2 %
Eosinophils Absolute: 143 {cells}/uL (ref 15–500)
Eosinophils Relative: 1.7 %
HCT: 42.6 % (ref 38.5–50.0)
Hemoglobin: 14.3 g/dL (ref 13.2–17.1)
MCH: 31.2 pg (ref 27.0–33.0)
MCHC: 33.6 g/dL (ref 32.0–36.0)
MCV: 92.8 fL (ref 80.0–100.0)
MPV: 11.8 fL (ref 7.5–12.5)
Monocytes Relative: 10.6 %
Neutro Abs: 4771 {cells}/uL (ref 1500–7800)
Neutrophils Relative %: 56.8 %
Platelets: 257 10*3/uL (ref 140–400)
RBC: 4.59 10*6/uL (ref 4.20–5.80)
RDW: 13.3 % (ref 11.0–15.0)
Total Lymphocyte: 30.7 %
WBC: 8.4 10*3/uL (ref 3.8–10.8)

## 2023-03-17 LAB — COMPLETE METABOLIC PANEL WITH GFR
AG Ratio: 1.7 (calc) (ref 1.0–2.5)
ALT: 30 U/L (ref 9–46)
AST: 21 U/L (ref 10–35)
Albumin: 4.3 g/dL (ref 3.6–5.1)
Alkaline phosphatase (APISO): 71 U/L (ref 35–144)
BUN: 14 mg/dL (ref 7–25)
CO2: 28 mmol/L (ref 20–32)
Calcium: 9.2 mg/dL (ref 8.6–10.3)
Chloride: 105 mmol/L (ref 98–110)
Creat: 0.9 mg/dL (ref 0.70–1.30)
Globulin: 2.5 g/dL (ref 1.9–3.7)
Glucose, Bld: 98 mg/dL (ref 65–99)
Potassium: 4.3 mmol/L (ref 3.5–5.3)
Sodium: 139 mmol/L (ref 135–146)
Total Bilirubin: 0.5 mg/dL (ref 0.2–1.2)
Total Protein: 6.8 g/dL (ref 6.1–8.1)
eGFR: 100 mL/min/{1.73_m2} (ref >=60)

## 2023-03-17 NOTE — Progress Notes (Signed)
 CBC and CMP WNL

## 2023-04-24 ENCOUNTER — Other Ambulatory Visit: Payer: Self-pay

## 2023-04-24 DIAGNOSIS — L4050 Arthropathic psoriasis, unspecified: Secondary | ICD-10-CM

## 2023-04-24 DIAGNOSIS — Z111 Encounter for screening for respiratory tuberculosis: Secondary | ICD-10-CM

## 2023-04-24 DIAGNOSIS — L409 Psoriasis, unspecified: Secondary | ICD-10-CM

## 2023-04-24 DIAGNOSIS — Z79899 Other long term (current) drug therapy: Secondary | ICD-10-CM

## 2023-04-26 LAB — QUANTIFERON-TB GOLD PLUS
Mitogen-NIL: >10 [IU]/mL
NIL: 0.02 [IU]/mL
QuantiFERON-TB Gold Plus: NEGATIVE
TB1-NIL: 0.02 [IU]/mL
TB2-NIL: 0.01 [IU]/mL

## 2023-04-26 NOTE — Progress Notes (Signed)
 TB Gold is negative.

## 2023-05-25 ENCOUNTER — Other Ambulatory Visit: Payer: Self-pay | Admitting: Physician Assistant

## 2023-05-26 NOTE — Telephone Encounter (Signed)
 Last Fill: 03/03/2022  Next Visit: 06/20/2023  Last Visit: 03/16/2023  DX: Psoriatic arthritis   Current Dose per office note on 03/16/2023: folic acid  2 mg po qd.   Okay to refill folic acid ?

## 2023-06-05 ENCOUNTER — Ambulatory Visit (INDEPENDENT_AMBULATORY_CARE_PROVIDER_SITE_OTHER): Payer: Self-pay

## 2023-06-05 DIAGNOSIS — Z09 Encounter for follow-up examination after completed treatment for conditions other than malignant neoplasm: Secondary | ICD-10-CM | POA: Diagnosis present

## 2023-06-05 DIAGNOSIS — Z860101 Personal history of adenomatous and serrated colon polyps: Secondary | ICD-10-CM | POA: Diagnosis not present

## 2023-06-05 HISTORY — PX: COLONOSCOPY: SHX174

## 2023-06-06 NOTE — Progress Notes (Unsigned)
 Office Visit Note  Patient: Michael Peck             Date of Birth: Sep 29, 1965           MRN: [id number]             PCP: Michael San, MD Referring: Michael San, MD Visit Date: 06/20/2023 Occupation: @Michael Peck @  Subjective:  Medication monitoring   History of Present Illness: Michael Peck is a 58 y.o. male with history of psoriatic arthritis.  Patient is prescribed Taltz  80 mg sq injections every 28 days, Rasuvo  20mg  sq q wk and folic acid  2 mg po qd. initiated Taltz  on 01/09/2023.  He has been tolerating Taltz  without any side effects or injection site reactions.  He has noticed at least a 40% improvement in his symptoms and switching from Cosentyx  to Taltz .  Patient states that he is overdue for his Taltz  injection due to issues with the co-pay assistance program.  Patient would like to discuss other treatment options if he no longer will qualify for remaining on Taltz . He denies any recent or recurrent infections.  Denies any new medical conditions.  Patient states that since he is overdue for his Taltz  injection he has started to notice a recurrence of pain in the plantar fascia of the right foot as well as psoriasis on the right elbow.  He denies any SI joint pain at this time.  Patient states that he uses a TENS unit on his lower back which helps to alleviate his lower back discomfort.    Activities of Daily Living:  Patient reports morning stiffness for 2 hours.   Patient Reports nocturnal pain.  Difficulty dressing/grooming: Denies Difficulty climbing stairs: Denies Difficulty getting out of chair: Denies Difficulty using hands for taps, buttons, cutlery, and/or writing: Denies  Review of Systems  Constitutional:  Negative for fatigue.  HENT:  Negative for mouth sores and mouth dryness.   Eyes:  Negative for dryness.  Respiratory:  Negative for shortness of breath.   Cardiovascular:  Negative for chest pain and palpitations.  Gastrointestinal:  Negative for blood in stool,  constipation and diarrhea.  Endocrine: Negative for increased urination.  Genitourinary:  Negative for involuntary urination.  Musculoskeletal:  Positive for joint pain, joint pain and morning stiffness. Negative for gait problem, joint swelling, myalgias, muscle weakness, muscle tenderness and myalgias.  Skin:  Positive for rash. Negative for color change, hair loss and sensitivity to sunlight.  Allergic/Immunologic: Negative for susceptible to infections.  Neurological:  Negative for dizziness and headaches.  Hematological:  Negative for swollen glands.  Psychiatric/Behavioral:  Negative for depressed mood and sleep disturbance. The patient is not nervous/anxious.     PMFS History:  Patient Active Problem List   Diagnosis Date Noted   High risk medication use 12/10/2015   Low back pain 12/10/2015   Obesity 12/10/2015   Psoriasis 12/09/2015   Psoriatic arthritis (HCC) 12/09/2015    Past Medical History:  Diagnosis Date   Arthritis    Hypertension    Psoriasis 12/09/2015   Psoriatic arthritis (HCC) 12/09/2015   Failed Enbrel    Family History  Problem Relation Age of Onset   Epilepsy Mother    Cancer Father        prostate    Healthy Daughter    Past Surgical History:  Procedure Laterality Date   ADENOIDECTOMY     COLONOSCOPY  06/05/2023   Social History   Social History Narrative   Not on file   Immunization  History  Administered Date(s) Administered   Influenza Inj Mdck Quad Pf 11/07/2016, 11/03/2017   Influenza, High Dose Seasonal PF 11/07/2016, 11/03/2017   Influenza,inj,Quad PF,6+ Mos 11/03/2018   Influenza-Unspecified 11/07/2016, 11/01/2019   PFIZER(Purple Top)SARS-COV-2 Vaccination 05/17/2019, 06/12/2019, 11/01/2019     Objective: Vital Signs: BP 126/77 (BP Location: Left Arm, Patient Position: Sitting, Cuff Size: Normal)   Pulse 71   Resp 16   Ht 5\' 10"  (1.778 m)   Wt 230 lb (104.3 kg)   BMI 33.00 kg/m    Physical Exam Vitals and nursing note  reviewed.  Constitutional:      Appearance: He is well-developed.  HENT:     Head: Normocephalic and atraumatic.  Eyes:     Conjunctiva/sclera: Conjunctivae normal.     Pupils: Pupils are equal, round, and reactive to light.  Cardiovascular:     Rate and Rhythm: Normal rate and regular rhythm.     Heart sounds: Normal heart sounds.  Pulmonary:     Effort: Pulmonary effort is normal.     Breath sounds: Normal breath sounds.  Abdominal:     General: Bowel sounds are normal.     Palpations: Abdomen is soft.  Musculoskeletal:     Cervical back: Normal range of motion and neck supple.  Skin:    General: Skin is warm and dry.     Capillary Refill: Capillary refill takes less than 2 seconds.     Comments: Patch of psoriasis on extensor surface of the right elbow.   Neurological:     Mental Status: He is alert and oriented to person, place, and time.  Psychiatric:        Behavior: Behavior normal.      Musculoskeletal Exam: C-spine limited ROM.  No midline spinal tenderness.  No SI joint tenderness.  Shoulder joints, elbow joints, and wrist joints have good ROM with no discomfort.  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  Some inflammation was noted in the right first PIP joint.  PIP and DIP thickening.  Hip joints have good range of motion with no groin pain.  Knee joints have good range of motion no warmth or effusion.  Ankle joints have good range of motion with some thickening of the left ankle.  No evidence of Achilles tendinitis.  CDAI Exam: CDAI Score: -- Patient Global: --; Provider Global: -- Swollen: --; Tender: -- Joint Exam 06/20/2023   No joint exam has been documented for this visit   There is currently no information documented on the homunculus. Go to the Rheumatology activity and complete the homunculus joint exam.  Investigation: No additional findings.  Imaging: No results found.  Recent Labs: Lab Results  Component Value Date    WBC 8.4 03/16/2023   HGB 14.3 03/16/2023   PLT 257 03/16/2023   NA 139 03/16/2023   K 4.3 03/16/2023   CL 105 03/16/2023   CO2 28 03/16/2023   GLUCOSE 98 03/16/2023   BUN 14 03/16/2023   CREATININE 0.90 03/16/2023   BILITOT 0.5 03/16/2023   ALKPHOS 84 02/12/2019   AST 21 03/16/2023   ALT 30 03/16/2023   PROT 6.8 03/16/2023   ALBUMIN 4.2 02/12/2019   CALCIUM 9.2 03/16/2023   GFRAA 91 07/22/2020   QFTBGOLDPLUS NEGATIVE 04/24/2023    Speciality Comments: Inadequate response to Enbrel and Humira Cosentyx  November 2017  Procedures:  No procedures performed Allergies: Patient has no known allergies.    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  He initiated the loading dose of Taltz  on 01/09/23 and has been tolerating Taltz  without any side effects or injection site reactions.  He remains on Rasuvo  20 mg subcutaneous injections once weekly and folic acid  2 mg daily without interruption. He is currently overdue for his dose of Taltz  due to issues with the co-pay assistance program.  Since he is overdue for his injection he has noticed a recurrence of plantar fasciitis involving the right foot as well as a patch of psoriasis on the extensor surface of his right elbow.  A sample of Taltz  was provided to the patient today while the pharmacy team tries to figure out if he will qualify for Taltz  going forward.  His insurance will no longer cover Rasuvo  so he will be switching to Otrexup  20 mg subcu injections once weekly.  He was advised to notify us  if he develops any new or worsening symptoms.  He will follow up in 3 months or sooner if needed.    Psoriasis: Patch of psoriasis noted on extensor surface of right elbow.  He will remain on combination therapy  as prescribed.    High risk medication use - Taltz  80 mg sq injections every 28 days-started on 01/09/23.  He will remain on Otrexup  20mg  sq q wk and folic acid  2 mg po qd. Insurance will no longer cover rasuvo .  IR to Enbrel, Humira, and Cosentyx .  TB gold negative on 04/24/23. CBC and CMP updated on 03/16/23.  Orders for CBC and CMP released today.   No recent or recurrent infections.  Discussed the importance of holding taltz  and rasuvo  if he develops signs or symptoms of an infection and to resume once the infection has completely cleared.  - Plan: CBC with Differential/Platelet, Comprehensive metabolic panel with GFR  Sacroiliitis (HCC): No SI joint tenderness upon palpation.  He uses a TENS unit as needed for pain relief.   Other medical conditions are listed as follows:   History of hypertension: BP 126/77 today in the office.    History of obesity  Orders: Orders Placed This Encounter  Procedures   CBC with Differential/Platelet   Comprehensive metabolic panel with GFR   No orders of the defined types were placed in this encounter.    Follow-Up Instructions: Return in about 3 months (around 09/20/2023) for Psoriatic arthritis.   Romayne Clubs, PA-C  Note - This record has been created using Dragon software.  Chart creation errors have been sought, but may not always  have been located. Such creation errors do not reflect on  the standard of medical care.

## 2023-06-07 ENCOUNTER — Telehealth: Payer: Self-pay | Admitting: *Deleted

## 2023-06-07 NOTE — Telephone Encounter (Signed)
 Patient contacted the office stating he is having trouble with his insurance and Taltz .

## 2023-06-08 NOTE — Telephone Encounter (Signed)
 Submitted a Prior Authorization request to [facility] for TALTZ  via CoverMyMeds. Will update once we receive a response.  Key: [id number]  Per automated response: PA resolved meaning PA is active.  [facility]. Per rep, patient has $6000 copay even if copay card. He will need to call [phone] for assistance with copay. Rep unsure if this number was provided to patient  ATC patient but phone went straight to VM - left VM with phone number above for patient to follow-up  Geraldene Kleine, PharmD, MPH, BCPS, CPP Clinical Pharmacist (Rheumatology and Pulmonology)

## 2023-06-14 ENCOUNTER — Telehealth: Payer: Self-pay | Admitting: Pharmacist

## 2023-06-14 DIAGNOSIS — Z79899 Other long term (current) drug therapy: Secondary | ICD-10-CM

## 2023-06-14 DIAGNOSIS — L409 Psoriasis, unspecified: Secondary | ICD-10-CM

## 2023-06-14 DIAGNOSIS — L4050 Arthropathic psoriasis, unspecified: Secondary | ICD-10-CM

## 2023-06-14 NOTE — Telephone Encounter (Signed)
 Received letter from [facility] that Rasuvo  will no longer be covered starting 08/08/2023. Otrexup  will become preferred option. Will need to update PA closer to this date  MyChart message sent to pt advising of letter and that pharmacy team will f/u closer to this deadline  Geraldene Kleine, PharmD, MPH, BCPS, CPP Clinical Pharmacist (Rheumatology and Pulmonology)

## 2023-06-19 NOTE — Telephone Encounter (Signed)
 Email sent to Christiane Cowing, Taltz  rep for [facility] contact info

## 2023-06-19 NOTE — Telephone Encounter (Signed)
 Per patient he called for co-pay assistance and the options offered to him would still not be affordable. He also reported he had reached out the HR department of his employer's plan and was referred for services he would not qualify for.   Informed patient we will reach out to [facility] representative for more coverage guidance.   Tolu Kiron Osmun, PharmD [facility] PGY-1

## 2023-06-20 ENCOUNTER — Ambulatory Visit: Payer: Self-pay | Admitting: Physician Assistant

## 2023-06-20 ENCOUNTER — Encounter: Payer: Self-pay | Admitting: Physician Assistant

## 2023-06-20 ENCOUNTER — Ambulatory Visit: Payer: BC Managed Care – PPO | Attending: Physician Assistant | Admitting: Physician Assistant

## 2023-06-20 VITALS — BP 126/77 | HR 71 | Resp 16 | Ht 70.0 in | Wt 230.0 lb

## 2023-06-20 DIAGNOSIS — Z8679 Personal history of other diseases of the circulatory system: Secondary | ICD-10-CM

## 2023-06-20 DIAGNOSIS — Z8639 Personal history of other endocrine, nutritional and metabolic disease: Secondary | ICD-10-CM

## 2023-06-20 DIAGNOSIS — M461 Sacroiliitis, not elsewhere classified: Secondary | ICD-10-CM | POA: Diagnosis not present

## 2023-06-20 DIAGNOSIS — Z79899 Other long term (current) drug therapy: Secondary | ICD-10-CM

## 2023-06-20 DIAGNOSIS — L4050 Arthropathic psoriasis, unspecified: Secondary | ICD-10-CM

## 2023-06-20 DIAGNOSIS — L409 Psoriasis, unspecified: Secondary | ICD-10-CM

## 2023-06-20 LAB — COMPREHENSIVE METABOLIC PANEL WITH GFR
AG Ratio: 1.5 (calc) (ref 1.0–2.5)
ALT: 22 U/L (ref 9–46)
AST: 18 U/L (ref 10–35)
Albumin: 4.1 g/dL (ref 3.6–5.1)
Alkaline phosphatase (APISO): 72 U/L (ref 35–144)
BUN: 10 mg/dL (ref 7–25)
CO2: 28 mmol/L (ref 20–32)
Calcium: 9.1 mg/dL (ref 8.6–10.3)
Chloride: 105 mmol/L (ref 98–110)
Creat: 1.09 mg/dL (ref 0.70–1.30)
Globulin: 2.7 g/dL (ref 1.9–3.7)
Glucose, Bld: 108 mg/dL — ABNORMAL HIGH (ref 65–99)
Potassium: 4.2 mmol/L (ref 3.5–5.3)
Sodium: 141 mmol/L (ref 135–146)
Total Bilirubin: 0.4 mg/dL (ref 0.2–1.2)
Total Protein: 6.8 g/dL (ref 6.1–8.1)
eGFR: 79 mL/min/{1.73_m2} (ref >=60)

## 2023-06-20 LAB — CBC WITH DIFFERENTIAL/PLATELET
Absolute Lymphocytes: 2182 {cells}/uL (ref 850–3900)
Absolute Monocytes: 706 {cells}/uL (ref 200–950)
Basophils Absolute: 22 {cells}/uL (ref 0–200)
Basophils Relative: 0.3 %
Eosinophils Absolute: 79 {cells}/uL (ref 15–500)
Eosinophils Relative: 1.1 %
HCT: 41.4 % (ref 38.5–50.0)
Hemoglobin: 14.3 g/dL (ref 13.2–17.1)
MCH: 31.5 pg (ref 27.0–33.0)
MCHC: 34.5 g/dL (ref 32.0–36.0)
MCV: 91.2 fL (ref 80.0–100.0)
MPV: 11.9 fL (ref 7.5–12.5)
Monocytes Relative: 9.8 %
Neutro Abs: 4212 {cells}/uL (ref 1500–7800)
Neutrophils Relative %: 58.5 %
Platelets: 245 10*3/uL (ref 140–400)
RBC: 4.54 10*6/uL (ref 4.20–5.80)
RDW: 13.4 % (ref 11.0–15.0)
Total Lymphocyte: 30.3 %
WBC: 7.2 10*3/uL (ref 3.8–10.8)

## 2023-06-20 NOTE — Patient Instructions (Signed)
 Standing Labs We placed an order today for your standing lab work.   Please have your standing labs drawn in August and every 3 months   Please have your labs drawn 2 weeks prior to your appointment so that the provider can discuss your lab results at your appointment, if possible.  Please note that you may see your imaging and lab results in MyChart before we have reviewed them. We will contact you once all results are reviewed. Please allow our office up to 72 hours to thoroughly review all of the results before contacting the office for clarification of your results.  WALK-IN LAB HOURS  Monday through Thursday from 8:00 am -12:30 pm and 1:00 pm-4:00 pm and Friday from 8:00 am-12:00 pm.  Patients with office visits requiring labs will be seen before walk-in labs.  You may encounter longer than normal wait times. Please allow additional time. Wait times may be shorter on  Monday and Thursday afternoons.  We do not book appointments for walk-in labs. We appreciate your patience and understanding with our staff.   Labs are drawn by [facility]. Please bring your co-pay at the time of your lab draw.  You may receive a bill from [facility] for your lab work.  Please note if you are on Hydroxychloroquine and and an order has been placed for a Hydroxychloroquine level,  you will need to have it drawn 4 hours or more after your last dose.  If you wish to have your labs drawn at another location, please call the office 24 hours in advance so we can fax the orders.  The office is located at [address], [place], [place] [address]   If you have any questions regarding directions or hours of operation,  please call [phone].   As a reminder, please drink plenty of water prior to coming for your lab work. Thanks!

## 2023-06-20 NOTE — Progress Notes (Signed)
 Medication Samples have been provided to the patient.  Drug name: Taltz        Strength: 80 mg        Qty: 1 mL  LOT: [id number]  Exp.Date: 08/16/2024  Dosing instructions: Inject 80 mg into the skin every 4 weeks  The patient has been instructed regarding the correct time, dose, and frequency of taking this medication, including desired effects and most common side effects.   Michael Peck, PharmD [facility] PGY-1

## 2023-06-20 NOTE — Progress Notes (Signed)
 CBC WNL

## 2023-06-20 NOTE — Telephone Encounter (Signed)
 Medication Samples have been provided to the patient.  Drug name: Taltz        Strength: 80 mg        Qty: 1 mL  LOT: [id number]  Exp.Date: 08/16/2024  Dosing instructions: Inject 80 mg into the skin every 4 weeks  The patient has been instructed regarding the correct time, dose, and frequency of taking this medication, including desired effects and most common side effects.   Tolu Amauri Medellin, PharmD [facility] PGY-1

## 2023-06-21 NOTE — Progress Notes (Signed)
Glucose is 108. Rest of CMP WNL

## 2023-06-22 NOTE — Telephone Encounter (Signed)
 Reached out to [place], [place], regarding patient's Taltz  copay card issues. Left VM   [facility] phone: [phone]  Geraldene Kleine, PharmD, MPH, BCPS, CPP Clinical Pharmacist (Rheumatology and Pulmonology)

## 2023-08-14 NOTE — Telephone Encounter (Signed)
 Submitted an URGENT Prior Authorization request to [facility] for OTREXUP  via CoverMyMeds. Will update once we receive a response.  Key: [id number]

## 2023-08-14 NOTE — Telephone Encounter (Signed)
 Submitted an URGENT Prior Authorization request to [facility] for OTREXUP  via CoverMyMeds. Pending clinical questions to populate  Key: [id number]   Otrexup  copay card: BIN# [id number] PCN# [id number] GRP# [id number] ID# [id number]  Sherry Pennant, PharmD, MPH, BCPS, CPP Clinical Pharmacist (Rheumatology and Pulmonology)

## 2023-08-15 NOTE — Telephone Encounter (Signed)
 Clinical questions for Otrexup  PA completed

## 2023-08-16 MED ORDER — OTREXUP 20 MG/0.4ML ~~LOC~~ SOAJ: 20.0000 mg | SUBCUTANEOUS | 1 refills | Status: AC

## 2023-08-16 NOTE — Telephone Encounter (Signed)
 Received notification from [facility] regarding a prior authorization for OTREXUP . Authorization has been APPROVED from 08/15/2023 to 08/14/2024. Approval letter sent to scan center.  Authorization # [id number]  MyChart message sent to patient  Sherry Pennant, PharmD, MPH, BCPS, CPP Clinical Pharmacist (Rheumatology and Pulmonology)

## 2023-09-04 NOTE — Telephone Encounter (Signed)
 Per Burnard PRO, she is OOO through 08/09/23@ 1pm. Will f/u tomorrow regarding situation

## 2023-09-06 NOTE — Progress Notes (Signed)
 Office Visit Note  Patient: Michael Peck             Date of Birth: 23-Jun-1965           MRN: [id number]             PCP: Valora Agent, MD Referring: Valora Agent, MD Visit Date: 09/20/2023 Occupation: @GUAROCC @  Subjective:  Medication monitoring   History of Present Illness: Michael Peck is a 58 y.o. male with history of psoriatic arthritis.  He remains on otrexup  20 mg sq injections every week and folic acid  2 mg daily.  Patient reports that his last dose of Taltz  was administered in May.  Patient states he has had issues with the co-pay assistance so has had a gap in therapy.  Patient states that Otrexup  has been costing him about $500 per month.  Patient states that overall his symptoms have been well-controlled on Otrexup  as monotherapy.  He denies any increased joint pain or joint swelling.  He takes Aleve sparingly for symptomatic relief.  He denies any new medical conditions.  He denies any recent or recurrent infections.  Activities of Daily Living:  Patient reports morning stiffness for 0 minutes.   Patient Denies nocturnal pain.  Difficulty dressing/grooming: Denies Difficulty climbing stairs: Denies Difficulty getting out of chair: Denies Difficulty using hands for taps, buttons, cutlery, and/or writing: Denies  Review of Systems  Constitutional:  Negative for fatigue.  HENT:  Negative for mouth sores, mouth dryness and nose dryness.   Eyes:  Positive for dryness. Negative for pain.  Respiratory:  Negative for shortness of breath and difficulty breathing.   Cardiovascular:  Negative for chest pain and palpitations.  Gastrointestinal:  Negative for blood in stool, constipation and diarrhea.  Endocrine: Negative for increased urination.  Genitourinary:  Negative for involuntary urination.  Musculoskeletal:  Negative for joint pain, gait problem, joint pain, joint swelling, myalgias, muscle weakness, morning stiffness, muscle tenderness and myalgias.  Skin:  Positive  for rash. Negative for color change, hair loss and sensitivity to sunlight.  Allergic/Immunologic: Negative for susceptible to infections.  Neurological:  Negative for dizziness and headaches.  Hematological:  Negative for swollen glands.  Psychiatric/Behavioral:  Negative for depressed mood and sleep disturbance. The patient is not nervous/anxious.     PMFS History:  Patient Active Problem List   Diagnosis Date Noted   High risk medication use 12/10/2015   Low back pain 12/10/2015   Obesity 12/10/2015   Psoriasis 12/09/2015   Psoriatic arthritis (HCC) 12/09/2015    Past Medical History:  Diagnosis Date   Arthritis    Hypertension    Psoriasis 12/09/2015   Psoriatic arthritis (HCC) 12/09/2015   Failed Enbrel    Family History  Problem Relation Age of Onset   Epilepsy Mother    Cancer Father        prostate    Healthy Daughter    Past Surgical History:  Procedure Laterality Date   ADENOIDECTOMY     COLONOSCOPY  06/05/2023   Social History   Social History Narrative   Not on file   Immunization History  Administered Date(s) Administered   Influenza Inj Mdck Quad Pf 11/07/2016, 11/03/2017   Influenza, High Dose Seasonal PF 11/07/2016, 11/03/2017   Influenza,inj,Quad PF,6+ Mos 11/03/2018   Influenza-Unspecified 11/07/2016, 11/01/2019   PFIZER(Purple Top)SARS-COV-2 Vaccination 05/17/2019, 06/12/2019, 11/01/2019     Objective: Vital Signs: BP 128/76 (BP Location: Left Arm, Patient Position: Sitting, Cuff Size: Normal)   Pulse 60   Resp  16   Ht 5' 10 (1.778 m)   Wt 228 lb 12.8 oz (103.8 kg)   BMI 32.83 kg/m    Physical Exam Vitals and nursing note reviewed.  Constitutional:      Appearance: He is well-developed.  HENT:     Head: Normocephalic and atraumatic.  Eyes:     Conjunctiva/sclera: Conjunctivae normal.     Pupils: Pupils are equal, round, and reactive to light.  Cardiovascular:     Rate and Rhythm: Normal rate and regular rhythm.     Heart sounds:  Normal heart sounds.  Pulmonary:     Effort: Pulmonary effort is normal.     Breath sounds: Normal breath sounds.  Abdominal:     General: Bowel sounds are normal.     Palpations: Abdomen is soft.  Musculoskeletal:     Cervical back: Normal range of motion and neck supple.  Skin:    General: Skin is warm and dry.     Capillary Refill: Capillary refill takes less than 2 seconds.  Neurological:     Mental Status: He is alert and oriented to person, place, and time.  Psychiatric:        Behavior: Behavior normal.      Musculoskeletal Exam: C-spine has limited range of motion.  no midline spinal tenderness.  No SI joint tenderness.  Shoulder joints, elbow joints, wrist joints, MCPs, PIPs, DIPs have good range of motion with no synovitis.  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  Complete fist formation bilaterally.  Hip joints have good range of motion with no groin pain.  Knee joints have good range of motion no warmth or effusion.  Ankle joints have good range of motion with no tenderness or joint swelling.  No evidence of Achilles tendinitis or plantar fasciitis.  CDAI Exam: CDAI Score: -- Patient Global: --; Provider Global: -- Swollen: --; Tender: -- Joint Exam 09/20/2023   No joint exam has been documented for this visit   There is currently no information documented on the homunculus. Go to the Rheumatology activity and complete the homunculus joint exam.  Investigation: No additional findings.  Imaging: No results found.  Recent Labs: Lab Results  Component Value Date   WBC 7.2 06/20/2023   HGB 14.3 06/20/2023   PLT 245 06/20/2023   NA 141 06/20/2023   K 4.2 06/20/2023   CL 105 06/20/2023   CO2 28 06/20/2023   GLUCOSE 108 (H) 06/20/2023   BUN 10 06/20/2023   CREATININE 1.09 06/20/2023   BILITOT 0.4 06/20/2023   ALKPHOS 84 02/12/2019   AST 18 06/20/2023   ALT 22 06/20/2023   PROT 6.8 06/20/2023   ALBUMIN 4.2 02/12/2019   CALCIUM  9.1 06/20/2023   GFRAA 91 07/22/2020   QFTBGOLDPLUS NEGATIVE 04/24/2023    Speciality Comments: Inadequate response to Enbrel and Humira Cosentyx  November 2017  Procedures:  No procedures performed Allergies: Patient has no known allergies.     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  No SI joint tenderness upon palpation.  No evidence of Achilles tendinitis or plantar fasciitis.  His symptoms have been overall well-controlled on Otrexup  20 mg subcutaneous injections once weekly and folic acid  2 mg daily.  His last dose of Taltz  was administered in May 2025.  He has had  issues with co-pay assistance and has been unable to fill the prescription.  A sample of Taltz  was provided to the patient today. Devki Gajera, RPH has reached out to [employer] to try to uncover what the issue with coverage is. He will follow-up in the office in 5 months or sooner if needed.  Psoriasis: He has a few small patches of psoriasis on the extensor surface of both elbows.  He has clobetasol  cream which he can apply topically as needed for symptomatic relief.  High risk medication use - Taltz  80 mg sq injections every 28 days-started on 01/09/23.  CBC and CMP updated 06/20/23. Orders for CBC and CMP Released today.  TB gold negative on 04/22/23. No recent or recurrent infections. Discussed the importance of holding taltz  if he develops signs or symptoms of an infection and to resume once the infection has completely cleared.  - Plan: Comprehensive metabolic panel with GFR, CBC with Differential/Platelet  Sacroiliitis [facility]): Not currently symptomatic.  No SI joint tenderness upon palpation.  No nocturnal pain.  Other medical conditions are listed as follows:  History of hypertension: Blood pressure was  128/76 today in the office.  History of obesity  Orders: Orders Placed This Encounter  Procedures   Comprehensive metabolic panel with GFR   CBC with Differential/Platelet   No orders of the defined types were placed in this encounter.    Follow-Up Instructions: Return in about 5 months (around 02/20/2024) for Psoriatic arthritis.   Waddell CHRISTELLA Craze, PA-C  Note - This record has been created using Dragon software.  Chart creation errors have been sought, but may not always  have been located. Such creation errors do not reflect on  the standard of medical care.

## 2023-09-20 ENCOUNTER — Encounter: Payer: Self-pay | Admitting: Physician Assistant

## 2023-09-20 ENCOUNTER — Telehealth: Payer: Self-pay | Admitting: Pharmacist

## 2023-09-20 ENCOUNTER — Ambulatory Visit: Attending: Physician Assistant | Admitting: Physician Assistant

## 2023-09-20 VITALS — BP 128/76 | HR 60 | Resp 16 | Ht 70.0 in | Wt 228.8 lb

## 2023-09-20 DIAGNOSIS — L4050 Arthropathic psoriasis, unspecified: Secondary | ICD-10-CM | POA: Diagnosis not present

## 2023-09-20 DIAGNOSIS — Z79899 Other long term (current) drug therapy: Secondary | ICD-10-CM | POA: Diagnosis not present

## 2023-09-20 DIAGNOSIS — L409 Psoriasis, unspecified: Secondary | ICD-10-CM | POA: Diagnosis not present

## 2023-09-20 DIAGNOSIS — Z8679 Personal history of other diseases of the circulatory system: Secondary | ICD-10-CM

## 2023-09-20 DIAGNOSIS — M461 Sacroiliitis, not elsewhere classified: Secondary | ICD-10-CM | POA: Diagnosis not present

## 2023-09-20 DIAGNOSIS — Z8639 Personal history of other endocrine, nutritional and metabolic disease: Secondary | ICD-10-CM

## 2023-09-20 LAB — CBC WITH DIFFERENTIAL/PLATELET
Absolute Lymphocytes: 1966 {cells}/uL (ref 850–3900)
Absolute Monocytes: 610 {cells}/uL (ref 200–950)
Basophils Absolute: 22 {cells}/uL (ref 0–200)
Basophils Relative: 0.4 %
Eosinophils Absolute: 129 {cells}/uL (ref 15–500)
Eosinophils Relative: 2.3 %
HCT: 40.3 % (ref 38.5–50.0)
Hemoglobin: 13.5 g/dL (ref 13.2–17.1)
MCH: 31.4 pg (ref 27.0–33.0)
MCHC: 33.5 g/dL (ref 32.0–36.0)
MCV: 93.7 fL (ref 80.0–100.0)
MPV: 11.6 fL (ref 7.5–12.5)
Monocytes Relative: 10.9 %
Neutro Abs: 2873 {cells}/uL (ref 1500–7800)
Neutrophils Relative %: 51.3 %
Platelets: 236 Thousand/uL (ref 140–400)
RBC: 4.3 Million/uL (ref 4.20–5.80)
RDW: 13.6 % (ref 11.0–15.0)
Total Lymphocyte: 35.1 %
WBC: 5.6 Thousand/uL (ref 3.8–10.8)

## 2023-09-20 LAB — COMPREHENSIVE METABOLIC PANEL WITH GFR
AG Ratio: 1.8 (calc) (ref 1.0–2.5)
ALT: 30 U/L (ref 9–46)
AST: 23 U/L (ref 10–35)
Albumin: 4.2 g/dL (ref 3.6–5.1)
Alkaline phosphatase (APISO): 73 U/L (ref 35–144)
BUN: 15 mg/dL (ref 7–25)
CO2: 25 mmol/L (ref 20–32)
Calcium: 8.8 mg/dL (ref 8.6–10.3)
Chloride: 108 mmol/L (ref 98–110)
Creat: 0.99 mg/dL (ref 0.70–1.30)
Globulin: 2.4 g/dL (ref 1.9–3.7)
Glucose, Bld: 109 mg/dL — ABNORMAL HIGH (ref 65–99)
Potassium: 4.4 mmol/L (ref 3.5–5.3)
Sodium: 141 mmol/L (ref 135–146)
Total Bilirubin: 0.4 mg/dL (ref 0.2–1.2)
Total Protein: 6.6 g/dL (ref 6.1–8.1)
eGFR: 89 mL/min/1.73m2 (ref >=60)

## 2023-09-20 NOTE — Telephone Encounter (Signed)
 Pending labs from today, patient will be switching from Otrexup  (due to cost) to MTX vial/syringe.  Please [facility] rx [facility] [address] [facility] [place] [facility], PharmD, MPH, BCPS, CPP Clinical Pharmacist (Rheumatology and Pulmonology)

## 2023-09-20 NOTE — Progress Notes (Signed)
 Medication Samples have been provided to the patient.  Drug name: Taltz  80 mg/mL Qty: 1 pen LOT: [id number] AD Exp.Date: 07/22/2024  Dosing instructions: Inject 80mg  into the skin every 28 days.  Otrexup  has been unaffordable. He is amenable to switching back to vial/syringe  Belmont Valli, PharmD, MPH, BCPS, CPP Clinical Pharmacist (Rheumatology and Pulmonology)

## 2023-09-20 NOTE — Telephone Encounter (Signed)
 ATC Kelly - unable to reach. Left VM  Sherry Pennant, PharmD, MPH, BCPS, CPP Clinical Pharmacist (Rheumatology and Pulmonology)

## 2023-09-21 ENCOUNTER — Ambulatory Visit: Payer: Self-pay | Admitting: Physician Assistant

## 2023-09-21 ENCOUNTER — Telehealth: Payer: Self-pay

## 2023-09-21 MED ORDER — METHOTREXATE SODIUM CHEMO INJECTION 50 MG/2ML
20.0000 mg | INTRAMUSCULAR | 0 refills | Status: DC
Start: 2023-09-21 — End: 2023-09-25

## 2023-09-21 MED ORDER — BD TB SYRINGE 27G X 1/2" 1 ML MISC: 12.0000 | 3 refills | Status: AC

## 2023-09-21 NOTE — Telephone Encounter (Signed)
 Plan to switch to vial and syringe.

## 2023-09-21 NOTE — Telephone Encounter (Signed)
 Prescription has a hard stop so I am unable to sign

## 2023-09-21 NOTE — Progress Notes (Signed)
 CBC WNL Glucose is 109-rest of CMP WNL

## 2023-09-21 NOTE — Telephone Encounter (Signed)
 Contacted [facility] and advised patient plans to switch to vial and syringe.

## 2023-09-21 NOTE — Telephone Encounter (Signed)
 CBC WNL Glucose is 109-rest of CMP WNL

## 2023-09-21 NOTE — Telephone Encounter (Signed)
 Received a phone call from [place] at [facility]. Reena states the Otrexup  20 mg is on back order right now and they do not have it available. Reena states they have 10 mg, 15 mg, and 22.5 mg. Reena did not know if the provider would like to change the prescription or not. Sharon's call back number is [phone].   After reviewing the chart, appears patient will be changing to methotrexate  vial and syringe and will be sent in a new prescription soon. Please advise.

## 2023-09-23 ENCOUNTER — Other Ambulatory Visit: Payer: Self-pay | Admitting: Physician Assistant

## 2023-09-27 NOTE — Telephone Encounter (Signed)
 Received return call from [place], [place]  She states that patient has just about maxed out his Taltz  copay card. It is recommended that he call [phone] [place]) and they can take a detailed look at his copay card status, offer solutions if any based on his insurance plan. MyChart message sent to patient.  Sherry Pennant, PharmD, MPH, BCPS, CPP Clinical Pharmacist (Rheumatology and Pulmonology)

## 2023-12-04 ENCOUNTER — Other Ambulatory Visit: Payer: Self-pay

## 2023-12-04 ENCOUNTER — Telehealth: Payer: Self-pay

## 2023-12-04 DIAGNOSIS — L4050 Arthropathic psoriasis, unspecified: Secondary | ICD-10-CM

## 2023-12-04 DIAGNOSIS — L409 Psoriasis, unspecified: Secondary | ICD-10-CM

## 2023-12-04 DIAGNOSIS — Z79899 Other long term (current) drug therapy: Secondary | ICD-10-CM

## 2023-12-04 MED ORDER — TALTZ 80 MG/ML ~~LOC~~ SOAJ: SUBCUTANEOUS | 0 refills | Status: DC

## 2023-12-04 NOTE — Telephone Encounter (Signed)
 Refill request received via fax from [facility]  for Taltz   Last Fill: 01/09/2023, last filled 03/15/2023, patient had trouble with insurance.   Labs: 09/20/2023 CBC WNL Glucose is 109-rest of CMP WNL  TB Gold: 04/24/2023 TB Gold is negative.   Next Visit: 02/12/2024  Last Visit: 09/20/2023  IK:Ednmpjupr arthritis (HCC)   Current Dose per office note 09/20/2023: Taltz  80 mg sq injections every 28 days   Advised the patient a prior authorization was approved today for the Taltz . Advised the patient of the co pay card company phone number [phone]) and patient states he will give them a call. Patient states he would still like a prescription sent to the pharmacy so he can see how much it will cost.   Advised the patient he will be due to update labs next month. Patient verbalized understanding.   Okay to refill Taltz ?

## 2023-12-04 NOTE — Telephone Encounter (Signed)
 Received notification from [facility] regarding a prior authorization for TALTZ . Authorization has been APPROVED from 12/04/2023 to 12/03/2024. Approval letter sent to scan center.  Authorization # [id number]  Sherry Pennant, PharmD, MPH, BCPS, CPP Clinical Pharmacist [facility])

## 2023-12-04 NOTE — Telephone Encounter (Signed)
 Received faxed PA renewal form from [facility] for TALTZ . Completed PA questions and submitted request via fax along with supporting chart notes. Will await response.  Phone #: [phone] Fax #: [phone]

## 2024-01-29 NOTE — Progress Notes (Signed)
 "  Office Visit Note  Patient: Michael Peck             Date of Birth: 05-Jul-1965           MRN: [id number]             PCP: Valora Lynwood FALCON, MD Referring: Valora Lynwood FALCON, MD Visit Date: 02/12/2024 Occupation: Data Unavailable  Subjective:  Discuss medication options   History of Present Illness: Michael Peck is a 58 y.o. male with history of psoriatic arthritis.  Patient states that the last dose of Toltz was administered with a sample provided at the last office visit on 09/20/2023.  Patient states that he has been unable to take Taltz  on a monthly basis as prescribed due to cost.  Patient states that he decided to restart taking methotrexate  last week and will be administering the second dose this week.  Patient states that his psoriasis started to return in December 2025.  He has not been using any topical agents recently.  He experiences some fullness and stiffness in the left ankle.  He denies any joint swelling at this time.  He denies any Achilles tendinitis or plantar fasciitis.  He denies any joint pain. He denies any recent or recurrent infections.   Activities of Daily Living:  Patient reports morning stiffness for 0 minutes  Patient Denies nocturnal pain.  Difficulty dressing/grooming: Denies Difficulty climbing stairs: Denies Difficulty getting out of chair: Denies Difficulty using hands for taps, buttons, cutlery, and/or writing: Denies  Review of Systems  Constitutional:  Positive for fatigue. Negative for night sweats.  HENT:  Negative for mouth sores, mouth dryness and nose dryness.   Eyes:  Negative for redness and dryness.  Respiratory:  Negative for cough, shortness of breath and difficulty breathing.   Cardiovascular:  Negative for chest pain, palpitations, hypertension, irregular heartbeat and swelling in legs/feet.  Gastrointestinal:  Negative for constipation and diarrhea.  Endocrine: Negative for increased urination.  Genitourinary:  Negative for painful  urination.  Musculoskeletal:  Negative for joint pain, joint pain, joint swelling, myalgias, muscle weakness, morning stiffness, muscle tenderness and myalgias.  Skin:  Negative for color change, rash, hair loss, nodules/bumps, skin tightness, ulcers and sensitivity to sunlight.  Allergic/Immunologic: Negative for susceptible to infections.  Neurological:  Negative for dizziness, fainting, memory loss, night sweats and weakness.  Hematological:  Negative for swollen glands.  Psychiatric/Behavioral:  Positive for depressed mood and sleep disturbance. The patient is nervous/anxious.     PMFS History:  Patient Active Problem List   Diagnosis Date Noted   High risk medication use 12/10/2015   Low back pain 12/10/2015   Obesity 12/10/2015   Psoriasis 12/09/2015   Psoriatic arthritis (HCC) 12/09/2015    Past Medical History:  Diagnosis Date   Arthritis    Hypertension    Psoriasis 12/09/2015   Psoriatic arthritis (HCC) 12/09/2015   Failed Enbrel    Family History  Problem Relation Age of Onset   Epilepsy Mother    Cancer Father        prostate    Healthy Daughter    Past Surgical History:  Procedure Laterality Date   ADENOIDECTOMY     COLONOSCOPY  06/05/2023   Social History[1] Social History   Social History Narrative   Not on file     Immunization History  Administered Date(s) Administered   INFLUENZA, HIGH DOSE SEASONAL PF 11/07/2016, 11/03/2017   Influenza Inj Mdck Quad Pf 11/07/2016, 11/03/2017   Influenza,inj,Quad PF,6+ Mos 11/03/2018  Influenza-Unspecified 11/07/2016, 11/01/2019   PFIZER(Purple Top)SARS-COV-2 Vaccination 05/17/2019, 06/12/2019, 11/01/2019     Objective: Vital Signs: BP 131/75   Pulse (!) 56   Temp 99.3 F (37.4 C)   Resp 14   Ht 5' 10 (1.778 m)   Wt 229 lb 6.4 oz (104.1 kg)   BMI 32.92 kg/m    Physical Exam Vitals and nursing note reviewed.  Constitutional:      Appearance: He is well-developed.  HENT:     Head: Normocephalic  and atraumatic.  Eyes:     Conjunctiva/sclera: Conjunctivae normal.     Pupils: Pupils are equal, round, and reactive to light.  Cardiovascular:     Rate and Rhythm: Normal rate and regular rhythm.     Heart sounds: Normal heart sounds.  Pulmonary:     Effort: Pulmonary effort is normal.     Breath sounds: Normal breath sounds.  Abdominal:     General: Bowel sounds are normal.     Palpations: Abdomen is soft.  Musculoskeletal:     Cervical back: Normal range of motion and neck supple.  Skin:    General: Skin is warm and dry.     Capillary Refill: Capillary refill takes less than 2 seconds.     Comments: Psoriasis on the extensor surface of both elbows.  Neurological:     Mental Status: He is alert and oriented to person, place, and time.  Psychiatric:        Behavior: Behavior normal.      Musculoskeletal Exam: C-spine has slightly limited ROM.  No midline spinal tenderness.  No SI joint tenderness.  Shoulder joints, elbow joints, wrist joints, MCPs, PIPs, DIPs have good range of motion with no synovitis. 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  Hip joints have good range of motion with no groin pain.  Knee joints have good range of motion no warmth or effusion.  Ankle joints have good range ROM with synovial thickening and mild tenderness of the left ankle.prominence at base of achilles tendons.     CDAI Exam: CDAI Score: -- Patient Global: --; Provider Global: -- Swollen: --; Tender: -- Joint Exam 02/12/2024   No joint exam has been documented for this visit   There is currently no information documented on the homunculus. Go to the Rheumatology activity and complete the homunculus joint exam.  Investigation: No additional findings.  Imaging: No results found.  Recent Labs: Lab Results  Component Value Date   WBC 5.6 09/20/2023   HGB 13.5 09/20/2023   PLT 236 09/20/2023   NA 141 09/20/2023   K 4.4 09/20/2023   CL 108 09/20/2023    CO2 25 09/20/2023   GLUCOSE 109 (H) 09/20/2023   BUN 15 09/20/2023   CREATININE 0.99 09/20/2023   BILITOT 0.4 09/20/2023   ALKPHOS 84 02/12/2019   AST 23 09/20/2023   ALT 30 09/20/2023   PROT 6.6 09/20/2023   ALBUMIN 4.2 02/12/2019   CALCIUM 8.8 09/20/2023   GFRAA 91 07/22/2020   QFTBGOLDPLUS NEGATIVE 04/24/2023    Speciality Comments: Inadequate response to Enbrel and Humira Cosentyx  November 2017  Procedures:  No procedures performed Allergies: Patient has no known allergies.      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  He was provided  a sample of Taltz  at the last office visit on 09/20/2023 which he administered but has been off of Taltz  since then.  He decided to resume methotrexate  last week--currently on methotrexate  0.8 mL sq injections once weekly and folic acid  2 mg daily.  A refill of clobetasol  cream will be sent to the pharmacy today for which he can apply twice daily up to 14 days as needed. Different treatment options were discussed today in detail.  Discussed the option of switching to Skyrizi  due to less frequent dosing and more affordability.  Reviewed indications, contraindications, potential side effects of Skyrizi  today in detail.  All questions were addressed and consent was obtained.  Plan to apply for Skyrizi  through insurance.  He will remain on methotrexate  as combination therapy. He will follow-up in the office in 3 months or sooner if needed.  Counseled patient that Skyrizi  is a IL-23 inhibitor.  Counseled patient on purpose, proper use, and adverse effects of Skyrizi .  Reviewed the most common adverse effects including infection, URTIs, headache, fatigue, tinea infections, and injection site. Counseled patient that Skyrizi  should  be held for infection and prior to scheduled surgery.  Recommend annual influenza, PCV 15 or PCV20 or Pneumovax 23, and Shingrix as indicated. Advised that live vaccines should be avoided while taking Skyrizi .  Reviewed the importance of regular labs while on Skyrizi  therapy.  Will monitor CBC and CMP 1 month after starting and then every 3 months routinely thereafter. Will monitor TB gold annually. Standing orders placed.  Provided patient with medication education material and answered all questions.  Patient voiced understanding.  Patient consented to Skyrizi .  Will upload consent into the media tab.  Reviewed storage instructions of Skyrizi .  Patient verbalized understanding.  Will apply for Skyrizi  through patient's insurance and update when we receive a response.  Dose will be Skyrizi  150 mg subcuteanously at week 0, week 4 then every 12 weeks thereafter.  Prescription pending lab results and insurance approval.  Psoriasis: Patient is noted to have a flare of psoriasis in December 2025.  He is prescribed Taltz  80 mg sq injections once monthly.  He has been unable to remain on Taltz  consistently due to cost.  A sample of Taltz  was provided at the last office visit on 09/20/2023 which he administered.  He has not been on Taltz  since then.  Patient decided to resume methotrexate  last week due to the flare of psoriasis. Discussed the option of possibly switching to Skyrizi  due to less frequent dosing--plan to check for insurance approval. He will remain on methotrexate  as combination therapy.  A refill of clobetasol  cream will also be sent to the pharmacy today.  High risk medication use -Plan to apply for Skyrizi  150 mg Supples injections once every 12 weeks.  Taltz  80 mg sq injections every 28 days-started on 01/09/23--patient has been off of Taltz  for several months due to being unable to fill the prescription due to cost.  CBC and CMP updated on 09/20/23. Orders for CBC and CMP released today.  TB  gold negative on 04/24/23.  No recent or recurrent infections. Discussed the importance of holding taltz  if he develops signs or symptoms of an infection and to resume once the infection has completely cleared.  - Plan: CBC with Differential/Platelet, Comprehensive metabolic panel with GFR  Sacroiliitis: No SI joint tenderness upon palpation.  Other medical conditions are listed as follows:  History of hypertension: Blood pressure was 131/75 today in the office.  History of obesity  Orders:  Orders Placed This Encounter  Procedures   CBC with Differential/Platelet   Comprehensive metabolic panel with GFR   Meds ordered this encounter  Medications   clobetasol  cream (TEMOVATE ) 0.05 %    Sig: Apply 1 Application topically 2 (two) times daily.    Dispense:  45 g    Refill:  1     Follow-Up Instructions: Return in about 3 months (around 05/12/2024).   Waddell CHRISTELLA Craze, PA-C  Note - This record has been created using Dragon software.  Chart creation errors have been sought, but may not always  have been located. Such creation errors do not reflect on  the standard of medical care.     [1]  Social History Tobacco Use   Smoking status: Never    Passive exposure: Never   Smokeless tobacco: Never  Vaping Use   Vaping status: Never Used  Substance Use Topics   Alcohol use: No   Drug use: No   "

## 2024-02-12 ENCOUNTER — Encounter: Payer: Self-pay | Admitting: Physician Assistant

## 2024-02-12 ENCOUNTER — Ambulatory Visit: Attending: Physician Assistant | Admitting: Physician Assistant

## 2024-02-12 ENCOUNTER — Telehealth: Payer: Self-pay

## 2024-02-12 ENCOUNTER — Other Ambulatory Visit (HOSPITAL_COMMUNITY): Payer: Self-pay

## 2024-02-12 VITALS — BP 131/75 | HR 56 | Temp 99.3°F | Resp 14 | Ht 70.0 in | Wt 229.4 lb

## 2024-02-12 DIAGNOSIS — L409 Psoriasis, unspecified: Secondary | ICD-10-CM

## 2024-02-12 DIAGNOSIS — Z79899 Other long term (current) drug therapy: Secondary | ICD-10-CM

## 2024-02-12 DIAGNOSIS — L4050 Arthropathic psoriasis, unspecified: Secondary | ICD-10-CM | POA: Diagnosis not present

## 2024-02-12 DIAGNOSIS — Z8679 Personal history of other diseases of the circulatory system: Secondary | ICD-10-CM | POA: Diagnosis not present

## 2024-02-12 DIAGNOSIS — Z8639 Personal history of other endocrine, nutritional and metabolic disease: Secondary | ICD-10-CM | POA: Diagnosis not present

## 2024-02-12 DIAGNOSIS — M461 Sacroiliitis, not elsewhere classified: Secondary | ICD-10-CM | POA: Diagnosis not present

## 2024-02-12 MED ORDER — CLOBETASOL PROPIONATE 0.05 % EX CREA: 1.0000 | TOPICAL_CREAM | Freq: Two times a day (BID) | CUTANEOUS | 1 refills | Status: AC

## 2024-02-12 NOTE — Telephone Encounter (Signed)
 Clinical questions compelted

## 2024-02-12 NOTE — Patient Instructions (Signed)
 Risankizumab  Injection What is this medication? RISANKIZUMAB  (RIS an KIZ ue mab) treats autoimmune conditions, such as psoriasis, arthritis, Crohn disease, and ulcerative colitis. It works by slowing down an overactive immune system.  It is a monoclonal antibody. This medicine may be used for other purposes; ask your health care provider or pharmacist if you have questions. COMMON BRAND NAME(S): Skyrizi  What should I tell my care team before I take this medication? They need to know if you have any of these conditions: Hepatic disease Immune system problems Infection, such as tuberculosis (TB), bacterial, fungal or viral infections Recent or upcoming vaccine An unusual or allergic reaction to risankizumab , other medications, foods, dyes, or preservatives Pregnant or trying to get pregnant Breast-feeding How should I use this medication? This medication is injected into a vein or under the skin. It is given by your care team in a hospital or clinic setting. It may also be given at home. If you get this medication at home, you will be taught how to prepare and give it. Use exactly as directed. Take it as directed on the prescription label. Keep taking it unless your care team tells you to stop. If you use a pen, be sure to take off the outer needle cover before using the dose. It is important that you put your used needles and syringes in a special sharps container. Do not put them in a trash can. If you do not have a sharps container, call your pharmacist or care team to get one. A special MedGuide will be given to you by the pharmacist with each prescription and refill. Be sure to read this information carefully each time. This medication comes with INSTRUCTIONS FOR USE. Ask your pharmacist for directions on how to use this medication. Read the information carefully. Talk to your pharmacist or care team if you have questions. Talk to your care team about the use of this medication in children.  Special care may be needed. Overdosage: If you think you have taken too much of this medicine contact a poison control center or emergency room at once. NOTE: This medicine is only for you. Do not share this medicine with others. What if I miss a dose? It is important not to miss any doses. Talk to your care team about what to do if you miss a dose. What may interact with this medication? Do not take this medication with any of the following: Live vaccines This list may not describe all possible interactions. Give your health care provider a list of all the medicines, herbs, non-prescription drugs, or dietary supplements you use. Also tell them if you smoke, drink alcohol, or use illegal drugs. Some items may interact with your medicine. What should I watch for while using this medication? Visit your care team for regular checks on your progress. Tell your care team if your symptoms do not start to get better or if they get worse. You will be tested for tuberculosis (TB) before you start this medication. If your care team prescribes any medication for TB, you should start taking the TB medication before starting this medication. Make sure to finish the full course of TB medication. This medication may increase your risk of getting an infection. Call your care team for advice if you get a fever, chills, sore throat, or other symptoms of a cold or flu. Do not treat yourself. Try to avoid being around people who are sick. This medication can decrease the response to a vaccine. If you  need to get vaccinated, tell your care team if you have received this medication. Extra booster doses may be needed. Talk to your care team to see if a different vaccination schedule is needed. What side effects may I notice from receiving this medication? Side effects that you should report to your care team as soon as possible: Allergic reactions--skin rash, itching, hives, swelling of the face, lips, tongue, or  throat Infection--fever, chills, cough, sore throat, wounds that don't heal, pain or trouble when passing urine, general feeling of discomfort or being unwell Liver injury--right upper belly pain, loss of appetite, nausea, light-colored stool, dark yellow or brown urine, yellowing skin or eyes, unusual weakness or fatigue Side effects that usually do not require medical attention (report to your care team if they continue or are bothersome): Fatigue Headache Pain, redness, or irritation at injection site Runny or stuffy nose Sore throat This list may not describe all possible side effects. Call your doctor for medical advice about side effects. You may report side effects to FDA at 1-800-FDA-1088. Where should I keep my medication? Keep out of the reach of children and pets. Store in a refrigerator. Do not freeze. Protect from light. Keep it in the original carton until you are ready to take it. See product for storage information. Each product may have different instructions. Remove the dose from the carton about 30 to 45 minutes before it is time for you to take it. Get rid of any unused medication after the expiration date. To get rid of medications that are no longer needed or have expired: Take the medication to a medication take-back program. Check with your pharmacy or law enforcement to find a location. If you cannot return the medication, ask your pharmacist or care team how to get rid of this medication safely. NOTE: This sheet is a summary. It may not cover all possible information. If you have questions about this medicine, talk to your doctor, pharmacist, or health care provider.  2024 Elsevier/Gold Standard (2023-01-06 00:00:00)         Standing Labs We placed an order today for your standing lab work.   Please have your standing labs drawn in 1 month and then every 3 months.  Please have your labs drawn 2 weeks prior to your appointment so that the provider can discuss  your lab results at your appointment, if possible.  Please note that you may see your imaging and lab results in MyChart before we have reviewed them. We will contact you once all results are reviewed. Please allow our office up to 72 hours to thoroughly review all of the results before contacting the office for clarification of your results.  WALK-IN LAB HOURS  Monday through Thursday from 8:00 am - 4:30 pm and Friday from 8:00 am-12:00 pm.  Patients with office visits requiring labs will be seen before walk-in labs.  You may encounter longer than normal wait times. Please allow additional time. Wait times may be shorter on  Monday and Thursday afternoons.  We do not book appointments for walk-in labs. We appreciate your patience and understanding with our staff.   Labs are drawn by [facility]. Please bring your co-pay at the time of your lab draw.  You may receive a bill from [facility] for your lab work.  Please note if you are on Hydroxychloroquine and and an order has been placed for a Hydroxychloroquine level,  you will need to have it drawn 4 hours or more after your last  dose.  If you wish to have your labs drawn at another location, please call the office 24 hours in advance so we can fax the orders.  The office is located at [address], [address], [place], [place] [address]   If you have any questions regarding directions or hours of operation,  please call [phone].   As a reminder, please drink plenty of water prior to coming for your lab work. Thanks!

## 2024-02-12 NOTE — Telephone Encounter (Signed)
 Patient is Skyrizi  SQ new start  Submitted a Prior Authorization request to [facility] for SKYRIZI  SQ via CoverMyMeds. Pending OV note from today to populate to cmplete submission  Key: [id number]

## 2024-02-13 ENCOUNTER — Ambulatory Visit: Payer: Self-pay | Admitting: Physician Assistant

## 2024-02-13 LAB — CBC WITH DIFFERENTIAL/PLATELET
Absolute Lymphocytes: 2253 {cells}/uL (ref 850–3900)
Absolute Monocytes: 986 {cells}/uL — ABNORMAL HIGH (ref 200–950)
Basophils Absolute: 18 {cells}/uL (ref 0–200)
Basophils Relative: 0.2 %
Eosinophils Absolute: 114 {cells}/uL (ref 15–500)
Eosinophils Relative: 1.3 %
HCT: 45 % (ref 39.4–51.1)
Hemoglobin: 14.9 g/dL (ref 13.2–17.1)
MCH: 30.5 pg (ref 27.0–33.0)
MCHC: 33.1 g/dL (ref 31.6–35.4)
MCV: 92 fL (ref 81.4–101.7)
MPV: 12.2 fL (ref 7.5–12.5)
Monocytes Relative: 11.2 %
Neutro Abs: 5430 {cells}/uL (ref 1500–7800)
Neutrophils Relative %: 61.7 %
Platelets: 270 Thousand/uL (ref 140–400)
RBC: 4.89 Million/uL (ref 4.20–5.80)
RDW: 12.9 % (ref 11.0–15.0)
Total Lymphocyte: 25.6 %
WBC: 8.8 Thousand/uL (ref 3.8–10.8)

## 2024-02-13 LAB — COMPREHENSIVE METABOLIC PANEL WITH GFR
AG Ratio: 1.3 (calc) (ref 1.0–2.5)
ALT: 25 U/L (ref 9–46)
AST: 19 U/L (ref 10–35)
Albumin: 4.1 g/dL (ref 3.6–5.1)
Alkaline phosphatase (APISO): 76 U/L (ref 35–144)
BUN: 14 mg/dL (ref 7–25)
CO2: 25 mmol/L (ref 20–32)
Calcium: 9.3 mg/dL (ref 8.6–10.3)
Chloride: 107 mmol/L (ref 98–110)
Creat: 1.05 mg/dL (ref 0.70–1.30)
Globulin: 3.1 g/dL (ref 1.9–3.7)
Glucose, Bld: 99 mg/dL (ref 65–99)
Potassium: 5 mmol/L (ref 3.5–5.3)
Sodium: 142 mmol/L (ref 135–146)
Total Bilirubin: 0.6 mg/dL (ref 0.2–1.2)
Total Protein: 7.2 g/dL (ref 6.1–8.1)
eGFR: 83 mL/min/1.73m2

## 2024-02-13 NOTE — Progress Notes (Signed)
 CMP WNL Absolute monocytes are slightly elevated, rest of CBC WNL.  We will continue to monitor.

## 2024-02-14 ENCOUNTER — Telehealth: Payer: Self-pay

## 2024-02-14 MED ORDER — SKYRIZI PEN 150 MG/ML ~~LOC~~ SOAJ: SUBCUTANEOUS | 0 refills | Status: AC

## 2024-02-14 NOTE — Telephone Encounter (Signed)
 All prescriptions were appropriately sent. Called [facility]. Spoke with Kasey, [facility] and provided clarification. NFN   Cirilo Canner, PharmD, MPH, BCPS, CPP Clinical Pharmacist

## 2024-02-14 NOTE — Telephone Encounter (Signed)
 Received notification from [facility] regarding a prior authorization for SKYRIZI  SQ. Authorization has been APPROVED from 02/12/2024 to 02/11/2025. Approval letter sent to scan center.  Patient must fill through [facility]: [phone]  Authorization # [id number]  Patient enrolled into Skyrizi  copay card:   ID: [id number] Issued: 02/14/2024 Group: [id number] BIN: [id number] PCN: [id number]  Rx sent to [facility] and MyChart message sent to patient . I called patient to notify as well.  Sherry Pennant, PharmD, MPH, BCPS, CPP Clinical Pharmacist

## 2024-02-14 NOTE — Telephone Encounter (Signed)
 Received a fax from [facility] regarding the patient's Skyrizi  loading and maintenance directions, states they are missing frequency. Please advise. Phone: [phone], Fax: [phone].

## 2024-05-13 ENCOUNTER — Ambulatory Visit: Admitting: Physician Assistant
